# Patient Record
Sex: Female | Born: 1940 | Race: White | Hispanic: No | Marital: Married | Smoking: Never smoker
Health system: Southern US, Community
[De-identification: ages and names within clinical notes are randomized; demographics above are authoritative.]

## PROBLEM LIST (undated history)

## (undated) DIAGNOSIS — I10 Essential (primary) hypertension: Secondary | ICD-10-CM

## (undated) DIAGNOSIS — E7800 Pure hypercholesterolemia, unspecified: Secondary | ICD-10-CM

## (undated) DIAGNOSIS — H3530 Unspecified macular degeneration: Secondary | ICD-10-CM

## (undated) DIAGNOSIS — M8580 Other specified disorders of bone density and structure, unspecified site: Secondary | ICD-10-CM

## (undated) HISTORY — DX: Other specified disorders of bone density and structure, unspecified site: M85.80

## (undated) HISTORY — DX: Essential (primary) hypertension: I10

## (undated) HISTORY — DX: Pure hypercholesterolemia, unspecified: E78.00

## (undated) HISTORY — PX: INGUINAL HERNIA REPAIR: SUR1180

---

## 2005-09-25 ENCOUNTER — Ambulatory Visit: Payer: Self-pay | Admitting: Internal Medicine

## 2005-09-25 ENCOUNTER — Ambulatory Visit (HOSPITAL_COMMUNITY): Admission: RE | Admit: 2005-09-25 | Discharge: 2005-09-25 | Payer: Self-pay | Admitting: Internal Medicine

## 2012-12-23 ENCOUNTER — Ambulatory Visit: Payer: Medicare Other | Admitting: Gastroenterology

## 2012-12-23 ENCOUNTER — Encounter (INDEPENDENT_AMBULATORY_CARE_PROVIDER_SITE_OTHER): Payer: Self-pay

## 2012-12-23 ENCOUNTER — Other Ambulatory Visit: Payer: Self-pay

## 2012-12-23 ENCOUNTER — Encounter: Payer: Self-pay | Admitting: Gastroenterology

## 2012-12-23 ENCOUNTER — Ambulatory Visit (INDEPENDENT_AMBULATORY_CARE_PROVIDER_SITE_OTHER): Payer: Medicare Other | Admitting: Gastroenterology

## 2012-12-23 VITALS — HR 80 | Temp 98.2°F | Ht 64.0 in | Wt 186.8 lb

## 2012-12-23 DIAGNOSIS — Z1211 Encounter for screening for malignant neoplasm of colon: Secondary | ICD-10-CM

## 2012-12-24 ENCOUNTER — Telehealth: Payer: Self-pay

## 2012-12-24 NOTE — Telephone Encounter (Signed)
Gastroenterology Pre-Procedure Review  Request Date: 12/23/2012 Requesting Physician: Was on recall from here  Pt's last colonoscopy was 09/25/2005 by [facility] and he recommended her next in 5 years for high risk screening due to family history of colon cancer in her sister who was diagnosed with colon cancer in her 2's   PATIENT REVIEW QUESTIONS: The patient responded to the following health history questions as indicated:    1. Diabetes Melitis: no 2. Joint replacements in the past 12 months: no 3. Major health problems in the past 3 months: no 4. Has an artificial valve or MVP: no 5. Has a defibrillator: no 6. Has been advised in past to take antibiotics in advance of a procedure like teeth cleaning: no    MEDICATIONS & ALLERGIES:    Patient reports the following regarding taking any blood thinners:   Plavix? no Aspirin? YES Coumadin? no  Patient confirms/reports the following medications:  Current Outpatient Prescriptions  Medication Sig Dispense Refill  . NON FORMULARY Calcium    Once daily      . NON FORMULARY Fiber   Once daily      . NON FORMULARY Co Q 10    One tablet daily      . simvastatin (ZOCOR) 40 MG tablet Take 40 mg by mouth every evening.      . timolol (TIMOPTIC) 0.5 % ophthalmic solution Place 1 drop into both eyes daily.       . Multiple Vitamin (MULTIVITAMIN) capsule Take 1 capsule by mouth daily.       No current facility-administered medications for this visit.    Patient confirms/reports the following allergies:  No Known Allergies  No orders of the defined types were placed in this encounter.    AUTHORIZATION INFORMATION Primary Insurance:   ID #:   Group #:  Pre-Cert / Auth required:  Pre-Cert / Auth #:   Secondary Insurance:  ID #:  Group #:  Pre-Cert / Auth required:  Pre-Cert / Auth #:   SCHEDULE INFORMATION: Procedure has been scheduled as follows:  Date: 01/13/2013  Time: 8:45 AM  Location: [facility] Short Stay  This  Gastroenterology Pre-Precedure Review Form is being routed to the following provider(s): R. Roetta Sessions, MD

## 2012-12-24 NOTE — Progress Notes (Signed)
Patient triaged. Office visit opened in error.

## 2012-12-24 NOTE — Telephone Encounter (Signed)
Appropriate.

## 2012-12-25 MED ORDER — PEG-KCL-NACL-NASULF-NA ASC-C 100 G PO SOLR: 1.0000 | ORAL | Status: DC

## 2012-12-25 NOTE — Telephone Encounter (Signed)
Rx sent to the pharmacy and pt received instructions when she was triaged here in the office.

## 2013-01-13 ENCOUNTER — Encounter (HOSPITAL_COMMUNITY)
Admission: RE | Disposition: A | Discharge status: Home / Self Care | Payer: Self-pay | Source: Ambulatory Visit | Attending: Internal Medicine

## 2013-01-13 ENCOUNTER — Ambulatory Visit (HOSPITAL_COMMUNITY)
Admission: RE | Admit: 2013-01-13 | Discharge: 2013-01-13 | Disposition: A | Discharge status: Home / Self Care | Payer: Medicare Other | Source: Ambulatory Visit | Attending: Internal Medicine | Admitting: Internal Medicine

## 2013-01-13 ENCOUNTER — Encounter (HOSPITAL_COMMUNITY): Payer: Self-pay | Admitting: *Deleted

## 2013-01-13 DIAGNOSIS — K620 Anal polyp: Secondary | ICD-10-CM

## 2013-01-13 DIAGNOSIS — Z1211 Encounter for screening for malignant neoplasm of colon: Secondary | ICD-10-CM | POA: Insufficient documentation

## 2013-01-13 DIAGNOSIS — D128 Benign neoplasm of rectum: Secondary | ICD-10-CM | POA: Insufficient documentation

## 2013-01-13 DIAGNOSIS — K5730 Diverticulosis of large intestine without perforation or abscess without bleeding: Secondary | ICD-10-CM

## 2013-01-13 DIAGNOSIS — Z800 Family history of malignant neoplasm of digestive organs: Secondary | ICD-10-CM

## 2013-01-13 DIAGNOSIS — K621 Rectal polyp: Secondary | ICD-10-CM

## 2013-01-13 HISTORY — PX: COLONOSCOPY: SHX5424

## 2013-01-13 HISTORY — DX: Unspecified macular degeneration: H35.30

## 2013-01-13 SURGERY — COLONOSCOPY
Anesthesia: Moderate Sedation

## 2013-01-13 MED ORDER — MEPERIDINE HCL 100 MG/ML IJ SOLN
INTRAMUSCULAR | Status: AC
  Filled 2013-01-13: qty 2

## 2013-01-13 MED ORDER — ONDANSETRON HCL 4 MG/2ML IJ SOLN
INTRAMUSCULAR | Status: AC
  Filled 2013-01-13: qty 2

## 2013-01-13 MED ORDER — MIDAZOLAM HCL 5 MG/5ML IJ SOLN
INTRAMUSCULAR | Status: AC
  Filled 2013-01-13: qty 10

## 2013-01-13 MED ORDER — SODIUM CHLORIDE 0.9 % IV SOLN
INTRAVENOUS | Status: DC
  Administered 2013-01-13: 1000 mL via INTRAVENOUS

## 2013-01-13 MED ORDER — STERILE WATER FOR IRRIGATION IR SOLN
Status: DC | PRN
  Administered 2013-01-13: 09:00:00

## 2013-01-13 MED ORDER — MEPERIDINE HCL 100 MG/ML IJ SOLN
INTRAMUSCULAR | Status: DC | PRN
  Administered 2013-01-13 (×2): 25 mg via INTRAVENOUS

## 2013-01-13 MED ORDER — MIDAZOLAM HCL 5 MG/5ML IJ SOLN
INTRAMUSCULAR | Status: DC | PRN
  Administered 2013-01-13 (×2): 1 mg via INTRAVENOUS
  Administered 2013-01-13: 2 mg via INTRAVENOUS

## 2013-01-13 MED ORDER — ONDANSETRON HCL 4 MG/2ML IJ SOLN
INTRAMUSCULAR | Status: DC | PRN
  Administered 2013-01-13: 4 mg via INTRAVENOUS

## 2013-01-13 NOTE — Op Note (Signed)
[facility] [address] [place], [address]   COLONOSCOPY PROCEDURE REPORT  PATIENT: Sabrina, Cantrell  MR#:         [id number] BIRTHDATE: [birth year] , 72  yrs. old GENDER: Female ENDOSCOPIST: R.  Roetta Sessions, MD FACP [facility] REFERRED BY:  Donzetta Sprung, M.D. PROCEDURE DATE:  01/13/2013 PROCEDURE:     Colonoscopy with snare polypectomy  INDICATIONS: High-risk rectal cancer screening examination  INFORMED CONSENT:  The risks, benefits, alternatives and imponderables including but not limited to bleeding, perforation as well as the possibility of a missed lesion have been reviewed.  The potential for biopsy, lesion removal, etc. have also been discussed.  Questions have been answered.  All parties agreeable. Please see the history and physical in the medical record for more information.  MEDICATIONS: Versed 4 mg IV and Demerol 50 mg IV in divided doses. Zofran 4 mg IV  DESCRIPTION OF PROCEDURE:  After a digital rectal exam was performed, the EC-[id number] ([id number])  colonoscope was advanced from the anus through the rectum and colon to the area of the cecum, ileocecal valve and appendiceal orifice.  The cecum was deeply intubated.  These structures were well-seen and photographed for the record.  From the level of the cecum and ileocecal valve, the scope was slowly and cautiously withdrawn.  The mucosal surfaces were carefully surveyed utilizing scope tip deflection to facilitate fold flattening as needed.  The scope was pulled down into the rectum where a thorough examination including retroflexion was performed.    FINDINGS:  External hemorrhoids. Adequate preparation. (1) 5 mm polyp in at 10 cm from the anal verge; otherwise, the remainder of the rectal mucosa appeared normal.  The patient had left-sided diverticula; the remainder of the colonic mucosa appeared normal.  THERAPEUTIC / DIAGNOSTIC MANEUVERS PERFORMED:  The above-mentioned polyp was cold snare  removed and recovered for the pathologist.  COMPLICATIONS: None  CECAL WITHDRAWAL TIME:  11 minutes  IMPRESSION:  Colonic diverticulosis. Rectal polyp-removed as described above  RECOMMENDATIONS: Followup on pathology.   _______________________________ eSigned:  R. Roetta Sessions, MD FACP [facility] 01/13/2013 9:13 AM   CC:

## 2013-01-13 NOTE — H&P (Signed)
Primary Care Physician:  Donzetta Sprung, MD Primary Gastroenterologist:  Dr. Jena Gauss  Pre-Procedure History & Physical: HPI:  ONEIKA SIMONIAN is a 72 y.o. female is here for a screening colonoscopy. Positive family history first-degree relative.   No bowel symptoms. Last colonoscopy in 2007 . Past Medical History  Diagnosis Date  . Macular degeneration     History reviewed. No pertinent past surgical history.  Prior to Admission medications   Medication Sig Start Date End Date Taking? Authorizing Provider  Multiple Vitamin (MULTIVITAMIN) capsule Take 1 capsule by mouth daily.   Yes Historical Provider, MD  NON FORMULARY Calcium    Once daily   Yes Historical Provider, MD  NON FORMULARY Fiber   Once daily   Yes Historical Provider, MD  NON FORMULARY Co Q 10    One tablet daily   Yes Historical Provider, MD  peg 3350 powder (MOVIPREP) 100 G SOLR Take 1 kit (200 g total) by mouth as directed. 12/24/12  Yes Corbin Ade, MD  simvastatin (ZOCOR) 40 MG tablet Take 40 mg by mouth every evening.   Yes Historical Provider, MD  timolol (TIMOPTIC) 0.5 % ophthalmic solution Place 1 drop into both eyes daily.  11/11/12  Yes Historical Provider, MD  aspirin 81 MG tablet Take 81 mg by mouth daily.    Historical Provider, MD    Allergies as of 12/23/2012  . (No Known Allergies)    Family History  Problem Relation Age of Onset  . Colon cancer Sister     History   Social History  . Marital Status: Married    Spouse Name: N/A    Number of Children: N/A  . Years of Education: N/A   Occupational History  . Not on file.   Social History Main Topics  . Smoking status: Never Smoker   . Smokeless tobacco: Not on file  . Alcohol Use: No  . Drug Use: No  . Sexual Activity: Not on file   Other Topics Concern  . Not on file   Social History Narrative  . No narrative on file    Review of Systems: See HPI, otherwise negative ROS  Physical Exam: BP 166/96  Pulse 80  Temp(Src) 97.7 F  (36.5 C) (Oral)  Resp 24  Ht 5\' 4"  (1.626 m)  Wt 177 lb (80.287 kg)  BMI 30.37 kg/m2  SpO2 96% General:   Alert,  Well-developed, well-nourished, pleasant and cooperative in NAD Head:  Normocephalic and atraumatic. Eyes:  Sclera clear, no icterus.   Conjunctiva pink. Ears:  Normal auditory acuity. Nose:  No deformity, discharge,  or lesions. Mouth:  No deformity or lesions, dentition normal. Neck:  Supple; no masses or thyromegaly. Lungs:  Clear throughout to auscultation.   No wheezes, crackles, or rhonchi. No acute distress. Heart:  Regular rate and rhythm; no murmurs, clicks, rubs,  or gallops. Abdomen:  Soft, nontender and nondistended. No masses, hepatosplenomegaly or hernias noted. Normal bowel sounds, without guarding, and without rebound.   Msk:  Symmetrical without gross deformities. Normal posture. Pulses:  Normal pulses noted. Extremities:  Without clubbing or edema. Neurologic:  Alert and  oriented x4;  grossly normal neurologically. Skin:  Intact without significant lesions or rashes. Cervical Nodes:  No significant cervical adenopathy. Psych:  Alert and cooperative. Normal mood and affect.  Impression/Plan: JIMMY STIPES is now here to undergo a screening colonoscopy.  High-risk screening examination.  Risks, benefits, limitations, imponderables and alternatives regarding colonoscopy have been reviewed with the patient.  Questions have been answered. All parties agreeable.

## 2013-01-14 ENCOUNTER — Encounter: Payer: Self-pay | Admitting: Internal Medicine

## 2013-01-20 ENCOUNTER — Encounter (HOSPITAL_COMMUNITY): Payer: Self-pay | Admitting: Internal Medicine

## 2014-03-01 DIAGNOSIS — H35051 Retinal neovascularization, unspecified, right eye: Secondary | ICD-10-CM | POA: Diagnosis not present

## 2014-03-01 DIAGNOSIS — H3532 Exudative age-related macular degeneration: Secondary | ICD-10-CM | POA: Diagnosis not present

## 2014-05-18 DIAGNOSIS — H524 Presbyopia: Secondary | ICD-10-CM | POA: Diagnosis not present

## 2014-05-18 DIAGNOSIS — H5210 Myopia, unspecified eye: Secondary | ICD-10-CM | POA: Diagnosis not present

## 2014-07-01 DIAGNOSIS — H3531 Nonexudative age-related macular degeneration: Secondary | ICD-10-CM | POA: Diagnosis not present

## 2014-07-01 DIAGNOSIS — H3532 Exudative age-related macular degeneration: Secondary | ICD-10-CM | POA: Diagnosis not present

## 2014-08-16 DIAGNOSIS — H3532 Exudative age-related macular degeneration: Secondary | ICD-10-CM | POA: Diagnosis not present

## 2014-08-19 DIAGNOSIS — Z1231 Encounter for screening mammogram for malignant neoplasm of breast: Secondary | ICD-10-CM | POA: Diagnosis not present

## 2014-10-01 DIAGNOSIS — H3532 Exudative age-related macular degeneration: Secondary | ICD-10-CM | POA: Diagnosis not present

## 2014-11-16 DIAGNOSIS — H3532 Exudative age-related macular degeneration: Secondary | ICD-10-CM | POA: Diagnosis not present

## 2014-11-30 DIAGNOSIS — Z0000 Encounter for general adult medical examination without abnormal findings: Secondary | ICD-10-CM | POA: Diagnosis not present

## 2014-11-30 DIAGNOSIS — R5383 Other fatigue: Secondary | ICD-10-CM | POA: Diagnosis not present

## 2014-11-30 DIAGNOSIS — E782 Mixed hyperlipidemia: Secondary | ICD-10-CM | POA: Diagnosis not present

## 2014-12-08 DIAGNOSIS — K219 Gastro-esophageal reflux disease without esophagitis: Secondary | ICD-10-CM | POA: Diagnosis not present

## 2014-12-08 DIAGNOSIS — E8881 Metabolic syndrome: Secondary | ICD-10-CM | POA: Diagnosis not present

## 2014-12-08 DIAGNOSIS — Z23 Encounter for immunization: Secondary | ICD-10-CM | POA: Diagnosis not present

## 2014-12-08 DIAGNOSIS — E782 Mixed hyperlipidemia: Secondary | ICD-10-CM | POA: Diagnosis not present

## 2014-12-08 DIAGNOSIS — I10 Essential (primary) hypertension: Secondary | ICD-10-CM | POA: Diagnosis not present

## 2014-12-08 DIAGNOSIS — Z0001 Encounter for general adult medical examination with abnormal findings: Secondary | ICD-10-CM | POA: Diagnosis not present

## 2015-01-11 DIAGNOSIS — H353211 Exudative age-related macular degeneration, right eye, with active choroidal neovascularization: Secondary | ICD-10-CM | POA: Diagnosis not present

## 2015-01-13 DIAGNOSIS — K219 Gastro-esophageal reflux disease without esophagitis: Secondary | ICD-10-CM | POA: Diagnosis not present

## 2015-01-13 DIAGNOSIS — E8881 Metabolic syndrome: Secondary | ICD-10-CM | POA: Diagnosis not present

## 2015-01-13 DIAGNOSIS — Z1389 Encounter for screening for other disorder: Secondary | ICD-10-CM | POA: Diagnosis not present

## 2015-01-13 DIAGNOSIS — I10 Essential (primary) hypertension: Secondary | ICD-10-CM | POA: Diagnosis not present

## 2015-01-13 DIAGNOSIS — Z9189 Other specified personal risk factors, not elsewhere classified: Secondary | ICD-10-CM | POA: Diagnosis not present

## 2015-01-13 DIAGNOSIS — E782 Mixed hyperlipidemia: Secondary | ICD-10-CM | POA: Diagnosis not present

## 2015-03-08 DIAGNOSIS — H35051 Retinal neovascularization, unspecified, right eye: Secondary | ICD-10-CM | POA: Diagnosis not present

## 2015-03-08 DIAGNOSIS — H353211 Exudative age-related macular degeneration, right eye, with active choroidal neovascularization: Secondary | ICD-10-CM | POA: Diagnosis not present

## 2015-03-08 DIAGNOSIS — H353222 Exudative age-related macular degeneration, left eye, with inactive choroidal neovascularization: Secondary | ICD-10-CM | POA: Diagnosis not present

## 2015-05-17 DIAGNOSIS — H353211 Exudative age-related macular degeneration, right eye, with active choroidal neovascularization: Secondary | ICD-10-CM | POA: Diagnosis not present

## 2015-06-14 DIAGNOSIS — R5382 Chronic fatigue, unspecified: Secondary | ICD-10-CM | POA: Diagnosis not present

## 2015-06-14 DIAGNOSIS — E782 Mixed hyperlipidemia: Secondary | ICD-10-CM | POA: Diagnosis not present

## 2015-06-14 DIAGNOSIS — I10 Essential (primary) hypertension: Secondary | ICD-10-CM | POA: Diagnosis not present

## 2015-06-14 DIAGNOSIS — K219 Gastro-esophageal reflux disease without esophagitis: Secondary | ICD-10-CM | POA: Diagnosis not present

## 2015-06-14 DIAGNOSIS — R5383 Other fatigue: Secondary | ICD-10-CM | POA: Diagnosis not present

## 2015-06-14 DIAGNOSIS — E8881 Metabolic syndrome: Secondary | ICD-10-CM | POA: Diagnosis not present

## 2015-06-16 DIAGNOSIS — E782 Mixed hyperlipidemia: Secondary | ICD-10-CM | POA: Diagnosis not present

## 2015-06-16 DIAGNOSIS — E8881 Metabolic syndrome: Secondary | ICD-10-CM | POA: Diagnosis not present

## 2015-06-16 DIAGNOSIS — K219 Gastro-esophageal reflux disease without esophagitis: Secondary | ICD-10-CM | POA: Diagnosis not present

## 2015-06-16 DIAGNOSIS — I10 Essential (primary) hypertension: Secondary | ICD-10-CM | POA: Diagnosis not present

## 2015-06-29 DIAGNOSIS — I10 Essential (primary) hypertension: Secondary | ICD-10-CM | POA: Diagnosis not present

## 2015-06-29 DIAGNOSIS — Z7982 Long term (current) use of aspirin: Secondary | ICD-10-CM | POA: Diagnosis not present

## 2015-06-29 DIAGNOSIS — Z79899 Other long term (current) drug therapy: Secondary | ICD-10-CM | POA: Diagnosis not present

## 2015-06-29 DIAGNOSIS — Z780 Asymptomatic menopausal state: Secondary | ICD-10-CM | POA: Diagnosis not present

## 2015-06-29 DIAGNOSIS — M810 Age-related osteoporosis without current pathological fracture: Secondary | ICD-10-CM | POA: Diagnosis not present

## 2015-06-29 DIAGNOSIS — E7800 Pure hypercholesterolemia, unspecified: Secondary | ICD-10-CM | POA: Diagnosis not present

## 2015-06-29 DIAGNOSIS — Z8262 Family history of osteoporosis: Secondary | ICD-10-CM | POA: Diagnosis not present

## 2015-08-08 DIAGNOSIS — H353222 Exudative age-related macular degeneration, left eye, with inactive choroidal neovascularization: Secondary | ICD-10-CM | POA: Diagnosis not present

## 2015-08-08 DIAGNOSIS — H35352 Cystoid macular degeneration, left eye: Secondary | ICD-10-CM | POA: Diagnosis not present

## 2015-08-08 DIAGNOSIS — H353211 Exudative age-related macular degeneration, right eye, with active choroidal neovascularization: Secondary | ICD-10-CM | POA: Diagnosis not present

## 2015-09-09 DIAGNOSIS — Z1231 Encounter for screening mammogram for malignant neoplasm of breast: Secondary | ICD-10-CM | POA: Diagnosis not present

## 2015-11-08 DIAGNOSIS — Z6833 Body mass index (BMI) 33.0-33.9, adult: Secondary | ICD-10-CM | POA: Diagnosis not present

## 2015-11-08 DIAGNOSIS — R05 Cough: Secondary | ICD-10-CM | POA: Diagnosis not present

## 2015-11-08 DIAGNOSIS — J019 Acute sinusitis, unspecified: Secondary | ICD-10-CM | POA: Diagnosis not present

## 2015-12-20 DIAGNOSIS — H353222 Exudative age-related macular degeneration, left eye, with inactive choroidal neovascularization: Secondary | ICD-10-CM | POA: Diagnosis not present

## 2015-12-20 DIAGNOSIS — H353211 Exudative age-related macular degeneration, right eye, with active choroidal neovascularization: Secondary | ICD-10-CM | POA: Diagnosis not present

## 2015-12-20 DIAGNOSIS — H35051 Retinal neovascularization, unspecified, right eye: Secondary | ICD-10-CM | POA: Diagnosis not present

## 2015-12-20 DIAGNOSIS — H35352 Cystoid macular degeneration, left eye: Secondary | ICD-10-CM | POA: Diagnosis not present

## 2015-12-26 DIAGNOSIS — H353211 Exudative age-related macular degeneration, right eye, with active choroidal neovascularization: Secondary | ICD-10-CM | POA: Diagnosis not present

## 2016-01-05 DIAGNOSIS — K219 Gastro-esophageal reflux disease without esophagitis: Secondary | ICD-10-CM | POA: Diagnosis not present

## 2016-01-05 DIAGNOSIS — R5383 Other fatigue: Secondary | ICD-10-CM | POA: Diagnosis not present

## 2016-01-05 DIAGNOSIS — E8881 Metabolic syndrome: Secondary | ICD-10-CM | POA: Diagnosis not present

## 2016-01-05 DIAGNOSIS — I10 Essential (primary) hypertension: Secondary | ICD-10-CM | POA: Diagnosis not present

## 2016-01-05 DIAGNOSIS — E782 Mixed hyperlipidemia: Secondary | ICD-10-CM | POA: Diagnosis not present

## 2016-01-10 DIAGNOSIS — Z23 Encounter for immunization: Secondary | ICD-10-CM | POA: Diagnosis not present

## 2016-01-10 DIAGNOSIS — E782 Mixed hyperlipidemia: Secondary | ICD-10-CM | POA: Diagnosis not present

## 2016-01-10 DIAGNOSIS — Z6832 Body mass index (BMI) 32.0-32.9, adult: Secondary | ICD-10-CM | POA: Diagnosis not present

## 2016-01-10 DIAGNOSIS — K219 Gastro-esophageal reflux disease without esophagitis: Secondary | ICD-10-CM | POA: Diagnosis not present

## 2016-01-10 DIAGNOSIS — I10 Essential (primary) hypertension: Secondary | ICD-10-CM | POA: Diagnosis not present

## 2016-01-10 DIAGNOSIS — Z1212 Encounter for screening for malignant neoplasm of rectum: Secondary | ICD-10-CM | POA: Diagnosis not present

## 2016-01-10 DIAGNOSIS — E8881 Metabolic syndrome: Secondary | ICD-10-CM | POA: Diagnosis not present

## 2016-01-10 DIAGNOSIS — Z0001 Encounter for general adult medical examination with abnormal findings: Secondary | ICD-10-CM | POA: Diagnosis not present

## 2016-02-02 DIAGNOSIS — H353211 Exudative age-related macular degeneration, right eye, with active choroidal neovascularization: Secondary | ICD-10-CM | POA: Diagnosis not present

## 2016-03-22 DIAGNOSIS — H353211 Exudative age-related macular degeneration, right eye, with active choroidal neovascularization: Secondary | ICD-10-CM | POA: Diagnosis not present

## 2016-05-21 DIAGNOSIS — H35352 Cystoid macular degeneration, left eye: Secondary | ICD-10-CM | POA: Diagnosis not present

## 2016-05-21 DIAGNOSIS — H353222 Exudative age-related macular degeneration, left eye, with inactive choroidal neovascularization: Secondary | ICD-10-CM | POA: Diagnosis not present

## 2016-05-21 DIAGNOSIS — H353211 Exudative age-related macular degeneration, right eye, with active choroidal neovascularization: Secondary | ICD-10-CM | POA: Diagnosis not present

## 2016-07-03 DIAGNOSIS — E782 Mixed hyperlipidemia: Secondary | ICD-10-CM | POA: Diagnosis not present

## 2016-07-03 DIAGNOSIS — I10 Essential (primary) hypertension: Secondary | ICD-10-CM | POA: Diagnosis not present

## 2016-07-05 DIAGNOSIS — E6609 Other obesity due to excess calories: Secondary | ICD-10-CM | POA: Diagnosis not present

## 2016-07-05 DIAGNOSIS — I10 Essential (primary) hypertension: Secondary | ICD-10-CM | POA: Diagnosis not present

## 2016-07-05 DIAGNOSIS — E782 Mixed hyperlipidemia: Secondary | ICD-10-CM | POA: Diagnosis not present

## 2016-07-05 DIAGNOSIS — H35352 Cystoid macular degeneration, left eye: Secondary | ICD-10-CM | POA: Diagnosis not present

## 2016-07-05 DIAGNOSIS — Z6831 Body mass index (BMI) 31.0-31.9, adult: Secondary | ICD-10-CM | POA: Diagnosis not present

## 2016-07-05 DIAGNOSIS — G4733 Obstructive sleep apnea (adult) (pediatric): Secondary | ICD-10-CM | POA: Diagnosis not present

## 2016-07-05 DIAGNOSIS — E8881 Metabolic syndrome: Secondary | ICD-10-CM | POA: Diagnosis not present

## 2016-07-05 DIAGNOSIS — Z1389 Encounter for screening for other disorder: Secondary | ICD-10-CM | POA: Diagnosis not present

## 2016-07-05 DIAGNOSIS — H353222 Exudative age-related macular degeneration, left eye, with inactive choroidal neovascularization: Secondary | ICD-10-CM | POA: Diagnosis not present

## 2016-07-05 DIAGNOSIS — K219 Gastro-esophageal reflux disease without esophagitis: Secondary | ICD-10-CM | POA: Diagnosis not present

## 2016-07-05 DIAGNOSIS — H353211 Exudative age-related macular degeneration, right eye, with active choroidal neovascularization: Secondary | ICD-10-CM | POA: Diagnosis not present

## 2016-07-31 DIAGNOSIS — K219 Gastro-esophageal reflux disease without esophagitis: Secondary | ICD-10-CM | POA: Diagnosis not present

## 2016-07-31 DIAGNOSIS — E119 Type 2 diabetes mellitus without complications: Secondary | ICD-10-CM | POA: Diagnosis not present

## 2016-07-31 DIAGNOSIS — G4733 Obstructive sleep apnea (adult) (pediatric): Secondary | ICD-10-CM | POA: Diagnosis not present

## 2016-07-31 DIAGNOSIS — E785 Hyperlipidemia, unspecified: Secondary | ICD-10-CM | POA: Diagnosis not present

## 2016-08-16 DIAGNOSIS — H353231 Exudative age-related macular degeneration, bilateral, with active choroidal neovascularization: Secondary | ICD-10-CM | POA: Diagnosis not present

## 2016-08-16 DIAGNOSIS — H353222 Exudative age-related macular degeneration, left eye, with inactive choroidal neovascularization: Secondary | ICD-10-CM | POA: Diagnosis not present

## 2016-08-16 DIAGNOSIS — H353124 Nonexudative age-related macular degeneration, left eye, advanced atrophic with subfoveal involvement: Secondary | ICD-10-CM | POA: Diagnosis not present

## 2016-08-16 DIAGNOSIS — H353134 Nonexudative age-related macular degeneration, bilateral, advanced atrophic with subfoveal involvement: Secondary | ICD-10-CM | POA: Diagnosis not present

## 2016-08-16 DIAGNOSIS — H353212 Exudative age-related macular degeneration, right eye, with inactive choroidal neovascularization: Secondary | ICD-10-CM | POA: Diagnosis not present

## 2016-08-16 DIAGNOSIS — H401133 Primary open-angle glaucoma, bilateral, severe stage: Secondary | ICD-10-CM | POA: Diagnosis not present

## 2016-08-16 DIAGNOSIS — H353114 Nonexudative age-related macular degeneration, right eye, advanced atrophic with subfoveal involvement: Secondary | ICD-10-CM | POA: Diagnosis not present

## 2016-09-26 DIAGNOSIS — H353124 Nonexudative age-related macular degeneration, left eye, advanced atrophic with subfoveal involvement: Secondary | ICD-10-CM | POA: Diagnosis not present

## 2016-09-26 DIAGNOSIS — H401132 Primary open-angle glaucoma, bilateral, moderate stage: Secondary | ICD-10-CM | POA: Diagnosis not present

## 2016-09-26 DIAGNOSIS — H2513 Age-related nuclear cataract, bilateral: Secondary | ICD-10-CM | POA: Diagnosis not present

## 2016-09-26 DIAGNOSIS — H353211 Exudative age-related macular degeneration, right eye, with active choroidal neovascularization: Secondary | ICD-10-CM | POA: Diagnosis not present

## 2016-10-04 DIAGNOSIS — H353134 Nonexudative age-related macular degeneration, bilateral, advanced atrophic with subfoveal involvement: Secondary | ICD-10-CM | POA: Diagnosis not present

## 2016-10-04 DIAGNOSIS — H401133 Primary open-angle glaucoma, bilateral, severe stage: Secondary | ICD-10-CM | POA: Diagnosis not present

## 2016-10-04 DIAGNOSIS — H353124 Nonexudative age-related macular degeneration, left eye, advanced atrophic with subfoveal involvement: Secondary | ICD-10-CM | POA: Diagnosis not present

## 2016-10-04 DIAGNOSIS — H353211 Exudative age-related macular degeneration, right eye, with active choroidal neovascularization: Secondary | ICD-10-CM | POA: Diagnosis not present

## 2016-10-04 DIAGNOSIS — H353114 Nonexudative age-related macular degeneration, right eye, advanced atrophic with subfoveal involvement: Secondary | ICD-10-CM | POA: Diagnosis not present

## 2016-11-12 DIAGNOSIS — H353124 Nonexudative age-related macular degeneration, left eye, advanced atrophic with subfoveal involvement: Secondary | ICD-10-CM | POA: Diagnosis not present

## 2016-11-12 DIAGNOSIS — H353211 Exudative age-related macular degeneration, right eye, with active choroidal neovascularization: Secondary | ICD-10-CM | POA: Diagnosis not present

## 2016-11-12 DIAGNOSIS — H2513 Age-related nuclear cataract, bilateral: Secondary | ICD-10-CM | POA: Diagnosis not present

## 2016-11-12 DIAGNOSIS — H401132 Primary open-angle glaucoma, bilateral, moderate stage: Secondary | ICD-10-CM | POA: Diagnosis not present

## 2016-11-13 DIAGNOSIS — G4733 Obstructive sleep apnea (adult) (pediatric): Secondary | ICD-10-CM | POA: Diagnosis not present

## 2016-11-15 DIAGNOSIS — H353124 Nonexudative age-related macular degeneration, left eye, advanced atrophic with subfoveal involvement: Secondary | ICD-10-CM | POA: Diagnosis not present

## 2016-11-15 DIAGNOSIS — H353211 Exudative age-related macular degeneration, right eye, with active choroidal neovascularization: Secondary | ICD-10-CM | POA: Diagnosis not present

## 2016-11-15 DIAGNOSIS — H353114 Nonexudative age-related macular degeneration, right eye, advanced atrophic with subfoveal involvement: Secondary | ICD-10-CM | POA: Diagnosis not present

## 2016-11-15 DIAGNOSIS — H353134 Nonexudative age-related macular degeneration, bilateral, advanced atrophic with subfoveal involvement: Secondary | ICD-10-CM | POA: Diagnosis not present

## 2016-11-15 DIAGNOSIS — H353222 Exudative age-related macular degeneration, left eye, with inactive choroidal neovascularization: Secondary | ICD-10-CM | POA: Diagnosis not present

## 2016-12-05 DIAGNOSIS — H401132 Primary open-angle glaucoma, bilateral, moderate stage: Secondary | ICD-10-CM | POA: Diagnosis not present

## 2016-12-05 DIAGNOSIS — H353124 Nonexudative age-related macular degeneration, left eye, advanced atrophic with subfoveal involvement: Secondary | ICD-10-CM | POA: Diagnosis not present

## 2016-12-05 DIAGNOSIS — H2513 Age-related nuclear cataract, bilateral: Secondary | ICD-10-CM | POA: Diagnosis not present

## 2016-12-05 DIAGNOSIS — H353211 Exudative age-related macular degeneration, right eye, with active choroidal neovascularization: Secondary | ICD-10-CM | POA: Diagnosis not present

## 2016-12-26 DIAGNOSIS — I10 Essential (primary) hypertension: Secondary | ICD-10-CM | POA: Diagnosis not present

## 2016-12-26 DIAGNOSIS — K219 Gastro-esophageal reflux disease without esophagitis: Secondary | ICD-10-CM | POA: Diagnosis not present

## 2016-12-26 DIAGNOSIS — R5383 Other fatigue: Secondary | ICD-10-CM | POA: Diagnosis not present

## 2016-12-26 DIAGNOSIS — E8881 Metabolic syndrome: Secondary | ICD-10-CM | POA: Diagnosis not present

## 2016-12-26 DIAGNOSIS — Z9189 Other specified personal risk factors, not elsewhere classified: Secondary | ICD-10-CM | POA: Diagnosis not present

## 2016-12-26 DIAGNOSIS — E782 Mixed hyperlipidemia: Secondary | ICD-10-CM | POA: Diagnosis not present

## 2017-01-01 DIAGNOSIS — Z1212 Encounter for screening for malignant neoplasm of rectum: Secondary | ICD-10-CM | POA: Diagnosis not present

## 2017-01-01 DIAGNOSIS — E782 Mixed hyperlipidemia: Secondary | ICD-10-CM | POA: Diagnosis not present

## 2017-01-01 DIAGNOSIS — I10 Essential (primary) hypertension: Secondary | ICD-10-CM | POA: Diagnosis not present

## 2017-01-01 DIAGNOSIS — E8881 Metabolic syndrome: Secondary | ICD-10-CM | POA: Diagnosis not present

## 2017-01-01 DIAGNOSIS — R7301 Impaired fasting glucose: Secondary | ICD-10-CM | POA: Diagnosis not present

## 2017-01-01 DIAGNOSIS — Z0001 Encounter for general adult medical examination with abnormal findings: Secondary | ICD-10-CM | POA: Diagnosis not present

## 2017-01-01 DIAGNOSIS — Z6832 Body mass index (BMI) 32.0-32.9, adult: Secondary | ICD-10-CM | POA: Diagnosis not present

## 2017-01-01 DIAGNOSIS — Z23 Encounter for immunization: Secondary | ICD-10-CM | POA: Diagnosis not present

## 2017-01-02 DIAGNOSIS — H353114 Nonexudative age-related macular degeneration, right eye, advanced atrophic with subfoveal involvement: Secondary | ICD-10-CM | POA: Diagnosis not present

## 2017-01-02 DIAGNOSIS — H353211 Exudative age-related macular degeneration, right eye, with active choroidal neovascularization: Secondary | ICD-10-CM | POA: Diagnosis not present

## 2017-01-02 DIAGNOSIS — H353134 Nonexudative age-related macular degeneration, bilateral, advanced atrophic with subfoveal involvement: Secondary | ICD-10-CM | POA: Diagnosis not present

## 2017-01-02 DIAGNOSIS — H353124 Nonexudative age-related macular degeneration, left eye, advanced atrophic with subfoveal involvement: Secondary | ICD-10-CM | POA: Diagnosis not present

## 2017-01-02 DIAGNOSIS — H353222 Exudative age-related macular degeneration, left eye, with inactive choroidal neovascularization: Secondary | ICD-10-CM | POA: Diagnosis not present

## 2017-01-22 DIAGNOSIS — Z1231 Encounter for screening mammogram for malignant neoplasm of breast: Secondary | ICD-10-CM | POA: Diagnosis not present

## 2017-03-04 DIAGNOSIS — H401133 Primary open-angle glaucoma, bilateral, severe stage: Secondary | ICD-10-CM | POA: Diagnosis not present

## 2017-03-04 DIAGNOSIS — H353134 Nonexudative age-related macular degeneration, bilateral, advanced atrophic with subfoveal involvement: Secondary | ICD-10-CM | POA: Diagnosis not present

## 2017-03-04 DIAGNOSIS — H353211 Exudative age-related macular degeneration, right eye, with active choroidal neovascularization: Secondary | ICD-10-CM | POA: Diagnosis not present

## 2017-03-04 DIAGNOSIS — H353114 Nonexudative age-related macular degeneration, right eye, advanced atrophic with subfoveal involvement: Secondary | ICD-10-CM | POA: Diagnosis not present

## 2017-03-04 DIAGNOSIS — H353124 Nonexudative age-related macular degeneration, left eye, advanced atrophic with subfoveal involvement: Secondary | ICD-10-CM | POA: Diagnosis not present

## 2017-04-11 DIAGNOSIS — J019 Acute sinusitis, unspecified: Secondary | ICD-10-CM | POA: Diagnosis not present

## 2017-04-11 DIAGNOSIS — Z6833 Body mass index (BMI) 33.0-33.9, adult: Secondary | ICD-10-CM | POA: Diagnosis not present

## 2017-04-29 DIAGNOSIS — H2513 Age-related nuclear cataract, bilateral: Secondary | ICD-10-CM | POA: Diagnosis not present

## 2017-04-29 DIAGNOSIS — H353211 Exudative age-related macular degeneration, right eye, with active choroidal neovascularization: Secondary | ICD-10-CM | POA: Diagnosis not present

## 2017-04-29 DIAGNOSIS — H353124 Nonexudative age-related macular degeneration, left eye, advanced atrophic with subfoveal involvement: Secondary | ICD-10-CM | POA: Diagnosis not present

## 2017-04-29 DIAGNOSIS — H401132 Primary open-angle glaucoma, bilateral, moderate stage: Secondary | ICD-10-CM | POA: Diagnosis not present

## 2017-05-16 DIAGNOSIS — H353211 Exudative age-related macular degeneration, right eye, with active choroidal neovascularization: Secondary | ICD-10-CM | POA: Diagnosis not present

## 2017-05-16 DIAGNOSIS — H401133 Primary open-angle glaucoma, bilateral, severe stage: Secondary | ICD-10-CM | POA: Diagnosis not present

## 2017-05-16 DIAGNOSIS — H4052X3 Glaucoma secondary to other eye disorders, left eye, severe stage: Secondary | ICD-10-CM | POA: Diagnosis not present

## 2017-05-16 DIAGNOSIS — H353114 Nonexudative age-related macular degeneration, right eye, advanced atrophic with subfoveal involvement: Secondary | ICD-10-CM | POA: Diagnosis not present

## 2017-05-16 DIAGNOSIS — H353124 Nonexudative age-related macular degeneration, left eye, advanced atrophic with subfoveal involvement: Secondary | ICD-10-CM | POA: Diagnosis not present

## 2017-07-09 DIAGNOSIS — I10 Essential (primary) hypertension: Secondary | ICD-10-CM | POA: Diagnosis not present

## 2017-07-09 DIAGNOSIS — E782 Mixed hyperlipidemia: Secondary | ICD-10-CM | POA: Diagnosis not present

## 2017-07-09 DIAGNOSIS — Z0001 Encounter for general adult medical examination with abnormal findings: Secondary | ICD-10-CM | POA: Diagnosis not present

## 2017-07-09 DIAGNOSIS — Z1331 Encounter for screening for depression: Secondary | ICD-10-CM | POA: Diagnosis not present

## 2017-07-09 DIAGNOSIS — Z1389 Encounter for screening for other disorder: Secondary | ICD-10-CM | POA: Diagnosis not present

## 2017-07-09 DIAGNOSIS — Z23 Encounter for immunization: Secondary | ICD-10-CM | POA: Diagnosis not present

## 2017-07-09 DIAGNOSIS — Z6832 Body mass index (BMI) 32.0-32.9, adult: Secondary | ICD-10-CM | POA: Diagnosis not present

## 2017-07-09 DIAGNOSIS — R7301 Impaired fasting glucose: Secondary | ICD-10-CM | POA: Diagnosis not present

## 2017-07-11 DIAGNOSIS — H4052X3 Glaucoma secondary to other eye disorders, left eye, severe stage: Secondary | ICD-10-CM | POA: Diagnosis not present

## 2017-07-11 DIAGNOSIS — H35051 Retinal neovascularization, unspecified, right eye: Secondary | ICD-10-CM | POA: Diagnosis not present

## 2017-07-11 DIAGNOSIS — H353114 Nonexudative age-related macular degeneration, right eye, advanced atrophic with subfoveal involvement: Secondary | ICD-10-CM | POA: Diagnosis not present

## 2017-07-11 DIAGNOSIS — H353211 Exudative age-related macular degeneration, right eye, with active choroidal neovascularization: Secondary | ICD-10-CM | POA: Diagnosis not present

## 2017-07-11 DIAGNOSIS — H401133 Primary open-angle glaucoma, bilateral, severe stage: Secondary | ICD-10-CM | POA: Diagnosis not present

## 2017-07-11 DIAGNOSIS — H353222 Exudative age-related macular degeneration, left eye, with inactive choroidal neovascularization: Secondary | ICD-10-CM | POA: Diagnosis not present

## 2017-08-13 DIAGNOSIS — H353124 Nonexudative age-related macular degeneration, left eye, advanced atrophic with subfoveal involvement: Secondary | ICD-10-CM | POA: Diagnosis not present

## 2017-08-13 DIAGNOSIS — H353211 Exudative age-related macular degeneration, right eye, with active choroidal neovascularization: Secondary | ICD-10-CM | POA: Diagnosis not present

## 2017-08-13 DIAGNOSIS — H2513 Age-related nuclear cataract, bilateral: Secondary | ICD-10-CM | POA: Diagnosis not present

## 2017-08-13 DIAGNOSIS — H401132 Primary open-angle glaucoma, bilateral, moderate stage: Secondary | ICD-10-CM | POA: Diagnosis not present

## 2017-08-22 DIAGNOSIS — M810 Age-related osteoporosis without current pathological fracture: Secondary | ICD-10-CM | POA: Diagnosis not present

## 2017-08-22 DIAGNOSIS — M85851 Other specified disorders of bone density and structure, right thigh: Secondary | ICD-10-CM | POA: Diagnosis not present

## 2017-09-30 DIAGNOSIS — H353212 Exudative age-related macular degeneration, right eye, with inactive choroidal neovascularization: Secondary | ICD-10-CM | POA: Diagnosis not present

## 2017-09-30 DIAGNOSIS — H353232 Exudative age-related macular degeneration, bilateral, with inactive choroidal neovascularization: Secondary | ICD-10-CM | POA: Diagnosis not present

## 2017-09-30 DIAGNOSIS — H353134 Nonexudative age-related macular degeneration, bilateral, advanced atrophic with subfoveal involvement: Secondary | ICD-10-CM | POA: Diagnosis not present

## 2017-09-30 DIAGNOSIS — H353114 Nonexudative age-related macular degeneration, right eye, advanced atrophic with subfoveal involvement: Secondary | ICD-10-CM | POA: Diagnosis not present

## 2017-09-30 DIAGNOSIS — H353124 Nonexudative age-related macular degeneration, left eye, advanced atrophic with subfoveal involvement: Secondary | ICD-10-CM | POA: Diagnosis not present

## 2017-09-30 DIAGNOSIS — H353222 Exudative age-related macular degeneration, left eye, with inactive choroidal neovascularization: Secondary | ICD-10-CM | POA: Diagnosis not present

## 2017-09-30 DIAGNOSIS — H401133 Primary open-angle glaucoma, bilateral, severe stage: Secondary | ICD-10-CM | POA: Diagnosis not present

## 2017-10-02 DIAGNOSIS — G4733 Obstructive sleep apnea (adult) (pediatric): Secondary | ICD-10-CM | POA: Diagnosis not present

## 2017-11-02 DIAGNOSIS — G4733 Obstructive sleep apnea (adult) (pediatric): Secondary | ICD-10-CM | POA: Diagnosis not present

## 2017-11-25 DIAGNOSIS — H353114 Nonexudative age-related macular degeneration, right eye, advanced atrophic with subfoveal involvement: Secondary | ICD-10-CM | POA: Diagnosis not present

## 2017-11-25 DIAGNOSIS — H4052X3 Glaucoma secondary to other eye disorders, left eye, severe stage: Secondary | ICD-10-CM | POA: Diagnosis not present

## 2017-11-25 DIAGNOSIS — H353222 Exudative age-related macular degeneration, left eye, with inactive choroidal neovascularization: Secondary | ICD-10-CM | POA: Diagnosis not present

## 2017-11-25 DIAGNOSIS — H401133 Primary open-angle glaucoma, bilateral, severe stage: Secondary | ICD-10-CM | POA: Diagnosis not present

## 2017-11-25 DIAGNOSIS — H353211 Exudative age-related macular degeneration, right eye, with active choroidal neovascularization: Secondary | ICD-10-CM | POA: Diagnosis not present

## 2017-11-25 DIAGNOSIS — H353134 Nonexudative age-related macular degeneration, bilateral, advanced atrophic with subfoveal involvement: Secondary | ICD-10-CM | POA: Diagnosis not present

## 2017-12-02 DIAGNOSIS — G4733 Obstructive sleep apnea (adult) (pediatric): Secondary | ICD-10-CM | POA: Diagnosis not present

## 2017-12-09 ENCOUNTER — Encounter: Payer: Self-pay | Admitting: Internal Medicine

## 2017-12-11 DIAGNOSIS — Z23 Encounter for immunization: Secondary | ICD-10-CM | POA: Diagnosis not present

## 2017-12-11 DIAGNOSIS — E6609 Other obesity due to excess calories: Secondary | ICD-10-CM | POA: Diagnosis not present

## 2017-12-11 DIAGNOSIS — E782 Mixed hyperlipidemia: Secondary | ICD-10-CM | POA: Diagnosis not present

## 2017-12-11 DIAGNOSIS — Z6833 Body mass index (BMI) 33.0-33.9, adult: Secondary | ICD-10-CM | POA: Diagnosis not present

## 2017-12-11 DIAGNOSIS — K219 Gastro-esophageal reflux disease without esophagitis: Secondary | ICD-10-CM | POA: Diagnosis not present

## 2017-12-11 DIAGNOSIS — E8881 Metabolic syndrome: Secondary | ICD-10-CM | POA: Diagnosis not present

## 2017-12-11 DIAGNOSIS — R7301 Impaired fasting glucose: Secondary | ICD-10-CM | POA: Diagnosis not present

## 2017-12-11 DIAGNOSIS — I10 Essential (primary) hypertension: Secondary | ICD-10-CM | POA: Diagnosis not present

## 2017-12-18 DIAGNOSIS — M545 Low back pain: Secondary | ICD-10-CM | POA: Diagnosis not present

## 2017-12-24 DIAGNOSIS — M545 Low back pain: Secondary | ICD-10-CM | POA: Diagnosis not present

## 2017-12-31 DIAGNOSIS — M545 Low back pain: Secondary | ICD-10-CM | POA: Diagnosis not present

## 2018-01-02 DIAGNOSIS — G4733 Obstructive sleep apnea (adult) (pediatric): Secondary | ICD-10-CM | POA: Diagnosis not present

## 2018-01-03 DIAGNOSIS — G4733 Obstructive sleep apnea (adult) (pediatric): Secondary | ICD-10-CM | POA: Diagnosis not present

## 2018-01-13 DIAGNOSIS — K219 Gastro-esophageal reflux disease without esophagitis: Secondary | ICD-10-CM | POA: Diagnosis not present

## 2018-01-13 DIAGNOSIS — E782 Mixed hyperlipidemia: Secondary | ICD-10-CM | POA: Diagnosis not present

## 2018-01-13 DIAGNOSIS — I10 Essential (primary) hypertension: Secondary | ICD-10-CM | POA: Diagnosis not present

## 2018-01-13 DIAGNOSIS — R7301 Impaired fasting glucose: Secondary | ICD-10-CM | POA: Diagnosis not present

## 2018-01-13 DIAGNOSIS — E8881 Metabolic syndrome: Secondary | ICD-10-CM | POA: Diagnosis not present

## 2018-01-14 DIAGNOSIS — M545 Low back pain: Secondary | ICD-10-CM | POA: Diagnosis not present

## 2018-01-15 DIAGNOSIS — R7301 Impaired fasting glucose: Secondary | ICD-10-CM | POA: Diagnosis not present

## 2018-01-15 DIAGNOSIS — E8881 Metabolic syndrome: Secondary | ICD-10-CM | POA: Diagnosis not present

## 2018-01-15 DIAGNOSIS — K219 Gastro-esophageal reflux disease without esophagitis: Secondary | ICD-10-CM | POA: Diagnosis not present

## 2018-01-15 DIAGNOSIS — M545 Low back pain: Secondary | ICD-10-CM | POA: Diagnosis not present

## 2018-01-15 DIAGNOSIS — Z6833 Body mass index (BMI) 33.0-33.9, adult: Secondary | ICD-10-CM | POA: Diagnosis not present

## 2018-01-15 DIAGNOSIS — E782 Mixed hyperlipidemia: Secondary | ICD-10-CM | POA: Diagnosis not present

## 2018-01-15 DIAGNOSIS — G4733 Obstructive sleep apnea (adult) (pediatric): Secondary | ICD-10-CM | POA: Diagnosis not present

## 2018-01-15 DIAGNOSIS — I10 Essential (primary) hypertension: Secondary | ICD-10-CM | POA: Diagnosis not present

## 2018-01-21 DIAGNOSIS — H353134 Nonexudative age-related macular degeneration, bilateral, advanced atrophic with subfoveal involvement: Secondary | ICD-10-CM | POA: Diagnosis not present

## 2018-01-21 DIAGNOSIS — H353114 Nonexudative age-related macular degeneration, right eye, advanced atrophic with subfoveal involvement: Secondary | ICD-10-CM | POA: Diagnosis not present

## 2018-01-21 DIAGNOSIS — H401133 Primary open-angle glaucoma, bilateral, severe stage: Secondary | ICD-10-CM | POA: Diagnosis not present

## 2018-01-21 DIAGNOSIS — H353222 Exudative age-related macular degeneration, left eye, with inactive choroidal neovascularization: Secondary | ICD-10-CM | POA: Diagnosis not present

## 2018-01-21 DIAGNOSIS — H353211 Exudative age-related macular degeneration, right eye, with active choroidal neovascularization: Secondary | ICD-10-CM | POA: Diagnosis not present

## 2018-01-21 DIAGNOSIS — H4052X3 Glaucoma secondary to other eye disorders, left eye, severe stage: Secondary | ICD-10-CM | POA: Diagnosis not present

## 2018-01-28 DIAGNOSIS — M545 Low back pain: Secondary | ICD-10-CM | POA: Diagnosis not present

## 2018-01-28 DIAGNOSIS — R262 Difficulty in walking, not elsewhere classified: Secondary | ICD-10-CM | POA: Diagnosis not present

## 2018-02-01 DIAGNOSIS — G4733 Obstructive sleep apnea (adult) (pediatric): Secondary | ICD-10-CM | POA: Diagnosis not present

## 2018-02-05 DIAGNOSIS — M545 Low back pain: Secondary | ICD-10-CM | POA: Diagnosis not present

## 2018-02-05 DIAGNOSIS — R262 Difficulty in walking, not elsewhere classified: Secondary | ICD-10-CM | POA: Diagnosis not present

## 2018-02-11 DIAGNOSIS — H2513 Age-related nuclear cataract, bilateral: Secondary | ICD-10-CM | POA: Diagnosis not present

## 2018-02-11 DIAGNOSIS — H353124 Nonexudative age-related macular degeneration, left eye, advanced atrophic with subfoveal involvement: Secondary | ICD-10-CM | POA: Diagnosis not present

## 2018-02-11 DIAGNOSIS — H401132 Primary open-angle glaucoma, bilateral, moderate stage: Secondary | ICD-10-CM | POA: Diagnosis not present

## 2018-02-11 DIAGNOSIS — H353211 Exudative age-related macular degeneration, right eye, with active choroidal neovascularization: Secondary | ICD-10-CM | POA: Diagnosis not present

## 2018-03-04 DIAGNOSIS — H353114 Nonexudative age-related macular degeneration, right eye, advanced atrophic with subfoveal involvement: Secondary | ICD-10-CM | POA: Diagnosis not present

## 2018-03-04 DIAGNOSIS — H401133 Primary open-angle glaucoma, bilateral, severe stage: Secondary | ICD-10-CM | POA: Diagnosis not present

## 2018-03-04 DIAGNOSIS — H353124 Nonexudative age-related macular degeneration, left eye, advanced atrophic with subfoveal involvement: Secondary | ICD-10-CM | POA: Diagnosis not present

## 2018-03-04 DIAGNOSIS — H353211 Exudative age-related macular degeneration, right eye, with active choroidal neovascularization: Secondary | ICD-10-CM | POA: Diagnosis not present

## 2018-03-04 DIAGNOSIS — G4733 Obstructive sleep apnea (adult) (pediatric): Secondary | ICD-10-CM | POA: Diagnosis not present

## 2018-03-04 DIAGNOSIS — H353134 Nonexudative age-related macular degeneration, bilateral, advanced atrophic with subfoveal involvement: Secondary | ICD-10-CM | POA: Diagnosis not present

## 2018-03-13 DIAGNOSIS — Z1231 Encounter for screening mammogram for malignant neoplasm of breast: Secondary | ICD-10-CM | POA: Diagnosis not present

## 2018-04-04 DIAGNOSIS — G4733 Obstructive sleep apnea (adult) (pediatric): Secondary | ICD-10-CM | POA: Diagnosis not present

## 2018-04-25 DIAGNOSIS — G4733 Obstructive sleep apnea (adult) (pediatric): Secondary | ICD-10-CM | POA: Diagnosis not present

## 2018-05-01 DIAGNOSIS — H353211 Exudative age-related macular degeneration, right eye, with active choroidal neovascularization: Secondary | ICD-10-CM | POA: Diagnosis not present

## 2018-05-01 DIAGNOSIS — H35352 Cystoid macular degeneration, left eye: Secondary | ICD-10-CM | POA: Diagnosis not present

## 2018-05-01 DIAGNOSIS — H35051 Retinal neovascularization, unspecified, right eye: Secondary | ICD-10-CM | POA: Diagnosis not present

## 2018-05-01 DIAGNOSIS — H3561 Retinal hemorrhage, right eye: Secondary | ICD-10-CM | POA: Diagnosis not present

## 2018-05-03 DIAGNOSIS — G4733 Obstructive sleep apnea (adult) (pediatric): Secondary | ICD-10-CM | POA: Diagnosis not present

## 2018-06-03 DIAGNOSIS — G4733 Obstructive sleep apnea (adult) (pediatric): Secondary | ICD-10-CM | POA: Diagnosis not present

## 2018-07-03 DIAGNOSIS — G4733 Obstructive sleep apnea (adult) (pediatric): Secondary | ICD-10-CM | POA: Diagnosis not present

## 2018-07-11 DIAGNOSIS — E782 Mixed hyperlipidemia: Secondary | ICD-10-CM | POA: Diagnosis not present

## 2018-07-11 DIAGNOSIS — I10 Essential (primary) hypertension: Secondary | ICD-10-CM | POA: Diagnosis not present

## 2018-07-11 DIAGNOSIS — R7301 Impaired fasting glucose: Secondary | ICD-10-CM | POA: Diagnosis not present

## 2018-07-11 DIAGNOSIS — E8881 Metabolic syndrome: Secondary | ICD-10-CM | POA: Diagnosis not present

## 2018-07-11 DIAGNOSIS — K219 Gastro-esophageal reflux disease without esophagitis: Secondary | ICD-10-CM | POA: Diagnosis not present

## 2018-07-15 DIAGNOSIS — H353114 Nonexudative age-related macular degeneration, right eye, advanced atrophic with subfoveal involvement: Secondary | ICD-10-CM | POA: Diagnosis not present

## 2018-07-15 DIAGNOSIS — R7301 Impaired fasting glucose: Secondary | ICD-10-CM | POA: Diagnosis not present

## 2018-07-15 DIAGNOSIS — K219 Gastro-esophageal reflux disease without esophagitis: Secondary | ICD-10-CM | POA: Diagnosis not present

## 2018-07-15 DIAGNOSIS — Z6833 Body mass index (BMI) 33.0-33.9, adult: Secondary | ICD-10-CM | POA: Diagnosis not present

## 2018-07-15 DIAGNOSIS — I10 Essential (primary) hypertension: Secondary | ICD-10-CM | POA: Diagnosis not present

## 2018-07-15 DIAGNOSIS — M545 Low back pain: Secondary | ICD-10-CM | POA: Diagnosis not present

## 2018-07-15 DIAGNOSIS — H353211 Exudative age-related macular degeneration, right eye, with active choroidal neovascularization: Secondary | ICD-10-CM | POA: Diagnosis not present

## 2018-07-15 DIAGNOSIS — E782 Mixed hyperlipidemia: Secondary | ICD-10-CM | POA: Diagnosis not present

## 2018-07-15 DIAGNOSIS — Z0001 Encounter for general adult medical examination with abnormal findings: Secondary | ICD-10-CM | POA: Diagnosis not present

## 2018-07-15 DIAGNOSIS — E8881 Metabolic syndrome: Secondary | ICD-10-CM | POA: Diagnosis not present

## 2018-08-03 DIAGNOSIS — G4733 Obstructive sleep apnea (adult) (pediatric): Secondary | ICD-10-CM | POA: Diagnosis not present

## 2018-09-02 DIAGNOSIS — G4733 Obstructive sleep apnea (adult) (pediatric): Secondary | ICD-10-CM | POA: Diagnosis not present

## 2018-09-09 DIAGNOSIS — H353211 Exudative age-related macular degeneration, right eye, with active choroidal neovascularization: Secondary | ICD-10-CM | POA: Diagnosis not present

## 2018-09-09 DIAGNOSIS — H353124 Nonexudative age-related macular degeneration, left eye, advanced atrophic with subfoveal involvement: Secondary | ICD-10-CM | POA: Diagnosis not present

## 2018-09-09 DIAGNOSIS — H353134 Nonexudative age-related macular degeneration, bilateral, advanced atrophic with subfoveal involvement: Secondary | ICD-10-CM | POA: Diagnosis not present

## 2018-09-09 DIAGNOSIS — H353114 Nonexudative age-related macular degeneration, right eye, advanced atrophic with subfoveal involvement: Secondary | ICD-10-CM | POA: Diagnosis not present

## 2018-09-09 DIAGNOSIS — H401133 Primary open-angle glaucoma, bilateral, severe stage: Secondary | ICD-10-CM | POA: Diagnosis not present

## 2018-09-11 DIAGNOSIS — G4733 Obstructive sleep apnea (adult) (pediatric): Secondary | ICD-10-CM | POA: Diagnosis not present

## 2018-10-03 DIAGNOSIS — G4733 Obstructive sleep apnea (adult) (pediatric): Secondary | ICD-10-CM | POA: Diagnosis not present

## 2018-11-04 DIAGNOSIS — H2513 Age-related nuclear cataract, bilateral: Secondary | ICD-10-CM | POA: Diagnosis not present

## 2018-11-04 DIAGNOSIS — H353211 Exudative age-related macular degeneration, right eye, with active choroidal neovascularization: Secondary | ICD-10-CM | POA: Diagnosis not present

## 2018-11-04 DIAGNOSIS — H353114 Nonexudative age-related macular degeneration, right eye, advanced atrophic with subfoveal involvement: Secondary | ICD-10-CM | POA: Diagnosis not present

## 2018-12-05 DIAGNOSIS — E782 Mixed hyperlipidemia: Secondary | ICD-10-CM | POA: Diagnosis not present

## 2018-12-05 DIAGNOSIS — I10 Essential (primary) hypertension: Secondary | ICD-10-CM | POA: Diagnosis not present

## 2018-12-05 DIAGNOSIS — E8881 Metabolic syndrome: Secondary | ICD-10-CM | POA: Diagnosis not present

## 2018-12-05 DIAGNOSIS — K219 Gastro-esophageal reflux disease without esophagitis: Secondary | ICD-10-CM | POA: Diagnosis not present

## 2018-12-05 DIAGNOSIS — R7301 Impaired fasting glucose: Secondary | ICD-10-CM | POA: Diagnosis not present

## 2018-12-05 DIAGNOSIS — R5383 Other fatigue: Secondary | ICD-10-CM | POA: Diagnosis not present

## 2018-12-09 DIAGNOSIS — I10 Essential (primary) hypertension: Secondary | ICD-10-CM | POA: Diagnosis not present

## 2018-12-09 DIAGNOSIS — R7301 Impaired fasting glucose: Secondary | ICD-10-CM | POA: Diagnosis not present

## 2018-12-09 DIAGNOSIS — E782 Mixed hyperlipidemia: Secondary | ICD-10-CM | POA: Diagnosis not present

## 2018-12-09 DIAGNOSIS — Z6833 Body mass index (BMI) 33.0-33.9, adult: Secondary | ICD-10-CM | POA: Diagnosis not present

## 2018-12-09 DIAGNOSIS — Z0001 Encounter for general adult medical examination with abnormal findings: Secondary | ICD-10-CM | POA: Diagnosis not present

## 2018-12-09 DIAGNOSIS — K219 Gastro-esophageal reflux disease without esophagitis: Secondary | ICD-10-CM | POA: Diagnosis not present

## 2018-12-09 DIAGNOSIS — Z23 Encounter for immunization: Secondary | ICD-10-CM | POA: Diagnosis not present

## 2018-12-09 DIAGNOSIS — E8881 Metabolic syndrome: Secondary | ICD-10-CM | POA: Diagnosis not present

## 2018-12-15 ENCOUNTER — Encounter: Payer: Self-pay | Admitting: Internal Medicine

## 2018-12-26 DIAGNOSIS — I10 Essential (primary) hypertension: Secondary | ICD-10-CM | POA: Diagnosis not present

## 2018-12-26 DIAGNOSIS — E782 Mixed hyperlipidemia: Secondary | ICD-10-CM | POA: Diagnosis not present

## 2018-12-30 ENCOUNTER — Ambulatory Visit (INDEPENDENT_AMBULATORY_CARE_PROVIDER_SITE_OTHER): Payer: Medicare HMO | Admitting: Gastroenterology

## 2018-12-30 ENCOUNTER — Other Ambulatory Visit: Payer: Self-pay

## 2018-12-30 ENCOUNTER — Encounter: Payer: Self-pay | Admitting: Gastroenterology

## 2018-12-30 DIAGNOSIS — Z86010 Personal history of colonic polyps: Secondary | ICD-10-CM

## 2018-12-30 DIAGNOSIS — Z800 Family history of malignant neoplasm of digestive organs: Secondary | ICD-10-CM

## 2018-12-30 MED ORDER — PEG 3350-KCL-NA BICARB-NACL 420 G PO SOLR: 4000.0000 mL | ORAL | 0 refills | Status: DC

## 2018-12-30 NOTE — Patient Instructions (Signed)
We have arranged a colonoscopy in the future with Dr. Gala Romney.  Have a wonderful holiday season!  It was a pleasure to see you today. I want to create trusting relationships with patients to provide genuine, compassionate, and quality care. I value your feedback. If you receive a survey regarding your visit,  I greatly appreciate you taking time to fill this out.   Annitta Needs, PhD, ANP-BC [facility] Gastroenterology

## 2018-12-30 NOTE — Progress Notes (Signed)
Primary Care Physician:  Caryl Bis, MD Primary Gastroenterologist:  Dr. Gala Romney   Chief Complaint  Patient presents with   Colonoscopy    wants to discuss d/t age    HPI:   Sabrina Cantrell is an 78 y.o. female presenting today at the request of Dr. Quillian Quince for surveillance colonoscopy. Last was in 2014 with tubular adenoma. Sister diagnosed with colon cancer at age 19.   She has no abdominal pain, change in bowel habits, rectal bleeding, dysphagia, GERD, loss of appetite, weight loss, melena, hematochezia. She is interested in a colonoscopy if benefits outweigh the risks. She would pursue further treatment if a cancer was found. She has never been hospitalized or had any surgeries.     Past Medical History:  Diagnosis Date   HTN (hypertension)    Hypercholesterolemia    Macular degeneration    Osteopenia     Past Surgical History:  Procedure Laterality Date   COLONOSCOPY N/A 01/13/2013   External hemorrhoids. 5 mm polyp (tubular adenoma) 10 cm from anal verge. Colonic diverticulosis.     Current Outpatient Medications  Medication Sig Dispense Refill   alendronate (FOSAMAX) 70 MG tablet Take 1 tablet by mouth once a week.     amLODipine (NORVASC) 5 MG tablet Take 1 tablet by mouth daily.     dorzolamide-timolol (COSOPT) 22.3-6.8 MG/ML ophthalmic solution Place 1 drop into both eyes 2 (two) times daily.     latanoprost (XALATAN) 0.005 % ophthalmic solution Place 1 drop into both eyes at bedtime.     losartan-hydrochlorothiazide (HYZAAR) 50-12.5 MG tablet Take 1 tablet by mouth daily.     NON FORMULARY Calcium    Once daily     NON FORMULARY Fiber   Once daily     simvastatin (ZOCOR) 40 MG tablet Take 40 mg by mouth every evening.     timolol (TIMOPTIC) 0.5 % ophthalmic solution Place 1 drop into both eyes daily.      peg 3350 powder (MOVIPREP) 100 G SOLR Take 1 kit (200 g total) by mouth as directed. (Patient not taking: Reported on 12/30/2018) 1  kit 0   No current facility-administered medications for this visit.     Allergies as of 12/30/2018   (No Known Allergies)    Family History  Problem Relation Age of Onset   Colon cancer Sister 48       deceased at age 37    Social History   Socioeconomic History   Marital status: Married    Spouse name: Not on file   Number of children: Not on file   Years of education: Not on file   Highest education level: Not on file  Occupational History   Occupation: retired  Scientist, product/process development strain: Not on file   Food insecurity    Worry: Not on file    Inability: Not on Lexicographer needs    Medical: Not on file    Non-medical: Not on file  Tobacco Use   Smoking status: Never Smoker   Smokeless tobacco: Never Used  Substance and Sexual Activity   Alcohol use: No   Drug use: No   Sexual activity: Not on file  Lifestyle   Physical activity    Days per week: Not on file    Minutes per session: Not on file   Stress: Not on file  Relationships   Social connections    Talks on phone: Not  on file    Gets together: Not on file    Attends religious service: Not on file    Active member of club or organization: Not on file    Attends meetings of clubs or organizations: Not on file    Relationship status: Not on file   Intimate partner violence    Fear of current or ex partner: Not on file    Emotionally abused: Not on file    Physically abused: Not on file    Forced sexual activity: Not on file  Other Topics Concern   Not on file  Social History Narrative   Not on file    Review of Systems: Gen: Denies any fever, chills, fatigue, weight loss, lack of appetite.  CV: Denies chest pain, heart palpitations, peripheral edema, syncope.  Resp: Denies shortness of breath at rest or with exertion. Denies wheezing or cough.  GI: see HPI GU : Denies urinary burning, urinary frequency, urinary hesitancy MS: Denies joint pain,  muscle weakness, cramps, or limitation of movement.  Derm: Denies rash, itching, dry skin Psych: Denies depression, anxiety, memory loss, and confusion Heme: Denies bruising, bleeding, and enlarged lymph nodes.  Physical Exam: BP (!) 145/88    Pulse 97    Temp (!) 96.6 F (35.9 C) (Temporal)    Ht 5' 3.5" (1.613 m)    Wt 196 lb 12.8 oz (89.3 kg)    BMI 34.31 kg/m  General:   Alert and oriented. Pleasant and cooperative. Well-nourished and well-developed. Appears younger than stated age.  Head:  Normocephalic and atraumatic. Eyes:  Without icterus, sclera clear and conjunctiva pink.  Ears:  Normal auditory acuity. Lungs:  Clear to auscultation bilaterally. No wheezes, rales, or rhonchi. No distress.  Heart:  S1, S2 present without murmurs appreciated.  Abdomen:  +BS, soft, non-tender and non-distended. No HSM noted. No guarding or rebound. No masses appreciated.  Rectal:  Deferred  Msk:  Symmetrical without gross deformities. Normal posture. Extremities:  Without  edema. Neurologic:  Alert and  oriented x4 Psych:  Alert and cooperative. Normal mood and affect.  ASSESSMENT: Sabrina Cantrell is an 78 y.o. female presenting today with a history of tubular adenoma in 2014, sister with colon cancer diagnosed at age 52, presenting today at request of PCP for surveillance colonoscopy. Although she is 70, she is in good health, active, and appears younger than stated age. She would like to pursue a colonoscopy at this time and understands the risks and benefits. We discussed that likely this would be the last colonoscopy, barring any unforeseen findings.    PLAN:  Proceed with TCS with Dr. Gala Romney in near future: the risks, benefits, and alternatives have been discussed with the patient in detail. The patient states understanding and desires to proceed.  Further recommendations to follow.   Annitta Needs, PhD, ANP-BC [facility] Gastroenterology

## 2019-01-01 DIAGNOSIS — H353211 Exudative age-related macular degeneration, right eye, with active choroidal neovascularization: Secondary | ICD-10-CM | POA: Diagnosis not present

## 2019-01-01 DIAGNOSIS — H353134 Nonexudative age-related macular degeneration, bilateral, advanced atrophic with subfoveal involvement: Secondary | ICD-10-CM | POA: Diagnosis not present

## 2019-01-01 DIAGNOSIS — H401133 Primary open-angle glaucoma, bilateral, severe stage: Secondary | ICD-10-CM | POA: Diagnosis not present

## 2019-01-01 DIAGNOSIS — H353114 Nonexudative age-related macular degeneration, right eye, advanced atrophic with subfoveal involvement: Secondary | ICD-10-CM | POA: Diagnosis not present

## 2019-01-01 DIAGNOSIS — H353124 Nonexudative age-related macular degeneration, left eye, advanced atrophic with subfoveal involvement: Secondary | ICD-10-CM | POA: Diagnosis not present

## 2019-01-01 DIAGNOSIS — H353222 Exudative age-related macular degeneration, left eye, with inactive choroidal neovascularization: Secondary | ICD-10-CM | POA: Diagnosis not present

## 2019-03-04 ENCOUNTER — Telehealth: Payer: Self-pay | Admitting: Internal Medicine

## 2019-03-04 ENCOUNTER — Telehealth: Payer: Self-pay

## 2019-03-04 NOTE — Telephone Encounter (Signed)
Called and informed pt Miralax instructions mailed.

## 2019-03-04 NOTE — Telephone Encounter (Signed)
PA for TCS submitted via [employer] website. Case went to clinical review. [place] tracking# [id number]. Clinical notes faxed to [facility].

## 2019-03-04 NOTE — Telephone Encounter (Signed)
PATIENT CALLED AND SAID THAT HER PREP IS ON BACK ORDER [phone]

## 2019-03-05 ENCOUNTER — Other Ambulatory Visit: Payer: Self-pay

## 2019-03-05 NOTE — Telephone Encounter (Signed)
TCS approved. [employer]# [id number], valid 03/18/19-04/17/19.

## 2019-03-10 DIAGNOSIS — H353211 Exudative age-related macular degeneration, right eye, with active choroidal neovascularization: Secondary | ICD-10-CM | POA: Diagnosis not present

## 2019-03-10 DIAGNOSIS — H353124 Nonexudative age-related macular degeneration, left eye, advanced atrophic with subfoveal involvement: Secondary | ICD-10-CM | POA: Diagnosis not present

## 2019-03-10 DIAGNOSIS — H353134 Nonexudative age-related macular degeneration, bilateral, advanced atrophic with subfoveal involvement: Secondary | ICD-10-CM | POA: Diagnosis not present

## 2019-03-10 DIAGNOSIS — H401133 Primary open-angle glaucoma, bilateral, severe stage: Secondary | ICD-10-CM | POA: Diagnosis not present

## 2019-03-10 DIAGNOSIS — H353114 Nonexudative age-related macular degeneration, right eye, advanced atrophic with subfoveal involvement: Secondary | ICD-10-CM | POA: Diagnosis not present

## 2019-03-10 DIAGNOSIS — H353222 Exudative age-related macular degeneration, left eye, with inactive choroidal neovascularization: Secondary | ICD-10-CM | POA: Diagnosis not present

## 2019-03-16 ENCOUNTER — Other Ambulatory Visit (HOSPITAL_COMMUNITY)
Admission: RE | Admit: 2019-03-16 | Discharge: 2019-03-16 | Disposition: A | Discharge status: Home / Self Care | Payer: Medicare HMO | Source: Ambulatory Visit | Attending: Internal Medicine | Admitting: Internal Medicine

## 2019-03-16 ENCOUNTER — Other Ambulatory Visit: Payer: Self-pay

## 2019-03-16 ENCOUNTER — Telehealth: Payer: Self-pay

## 2019-03-16 MED ORDER — PEG 3350-KCL-NA BICARB-NACL 420 G PO SOLR: 4000.0000 mL | ORAL | 0 refills | Status: DC

## 2019-03-16 NOTE — Telephone Encounter (Signed)
Pt called office and LMOVM requesting to cancel TCS scheduled for 03/18/19. She isn't comfortable having procedure at this time d/t COVID and she plans to receive COVID vaccine 03/18/19. Wants to wait to have procedure after getting vaccine.  Tried to call pt, no answer, LMOAM for return call.

## 2019-03-16 NOTE — Telephone Encounter (Signed)
Returning a call.  [phone]

## 2019-03-16 NOTE — Telephone Encounter (Signed)
Called pt, TCS rescheduled to 06/05/19 at 10:30am. COVID test 06/03/19 at 10:00am. LMOVM to inform endo scheduler. Unable to cancel COVID test scheduled for today as pt has been arrived. Endo scheduler informed.  Rx for Tri-Lyte resent to pharmacy, pt aware. Appt letter mailed with procedure instructions.

## 2019-03-16 NOTE — Telephone Encounter (Signed)
Called [employer] to change date of service for PA of TCS. Call was transferred to [facility]. Spoke to [employer]. Date of service for TCS changed to 06/05/19. Auth# [id number], valid 06/05/19-07/05/19.

## 2019-03-27 DIAGNOSIS — E7849 Other hyperlipidemia: Secondary | ICD-10-CM | POA: Diagnosis not present

## 2019-03-27 DIAGNOSIS — I10 Essential (primary) hypertension: Secondary | ICD-10-CM | POA: Diagnosis not present

## 2019-04-24 DIAGNOSIS — I10 Essential (primary) hypertension: Secondary | ICD-10-CM | POA: Diagnosis not present

## 2019-04-24 DIAGNOSIS — E7849 Other hyperlipidemia: Secondary | ICD-10-CM | POA: Diagnosis not present

## 2019-05-05 DIAGNOSIS — Z1231 Encounter for screening mammogram for malignant neoplasm of breast: Secondary | ICD-10-CM | POA: Diagnosis not present

## 2019-05-13 ENCOUNTER — Other Ambulatory Visit: Payer: Self-pay | Admitting: Family Medicine

## 2019-05-13 DIAGNOSIS — R928 Other abnormal and inconclusive findings on diagnostic imaging of breast: Secondary | ICD-10-CM | POA: Diagnosis not present

## 2019-05-13 DIAGNOSIS — N6489 Other specified disorders of breast: Secondary | ICD-10-CM

## 2019-05-20 ENCOUNTER — Ambulatory Visit
Admission: RE | Admit: 2019-05-20 | Discharge: 2019-05-20 | Disposition: A | Discharge status: Home / Self Care | Payer: Medicare HMO | Source: Ambulatory Visit | Attending: Family Medicine | Admitting: Family Medicine

## 2019-05-20 ENCOUNTER — Other Ambulatory Visit: Payer: Self-pay

## 2019-05-20 DIAGNOSIS — N6489 Other specified disorders of breast: Secondary | ICD-10-CM

## 2019-05-20 DIAGNOSIS — N6012 Diffuse cystic mastopathy of left breast: Secondary | ICD-10-CM | POA: Diagnosis not present

## 2019-05-20 DIAGNOSIS — R928 Other abnormal and inconclusive findings on diagnostic imaging of breast: Secondary | ICD-10-CM | POA: Diagnosis not present

## 2019-05-26 DIAGNOSIS — H353211 Exudative age-related macular degeneration, right eye, with active choroidal neovascularization: Secondary | ICD-10-CM | POA: Diagnosis not present

## 2019-05-26 DIAGNOSIS — H353222 Exudative age-related macular degeneration, left eye, with inactive choroidal neovascularization: Secondary | ICD-10-CM | POA: Diagnosis not present

## 2019-05-26 DIAGNOSIS — H353134 Nonexudative age-related macular degeneration, bilateral, advanced atrophic with subfoveal involvement: Secondary | ICD-10-CM | POA: Diagnosis not present

## 2019-05-26 DIAGNOSIS — H35051 Retinal neovascularization, unspecified, right eye: Secondary | ICD-10-CM | POA: Diagnosis not present

## 2019-05-26 DIAGNOSIS — H353124 Nonexudative age-related macular degeneration, left eye, advanced atrophic with subfoveal involvement: Secondary | ICD-10-CM | POA: Diagnosis not present

## 2019-05-27 DIAGNOSIS — E7849 Other hyperlipidemia: Secondary | ICD-10-CM | POA: Diagnosis not present

## 2019-05-27 DIAGNOSIS — I10 Essential (primary) hypertension: Secondary | ICD-10-CM | POA: Diagnosis not present

## 2019-06-02 ENCOUNTER — Other Ambulatory Visit (HOSPITAL_COMMUNITY)
Admission: RE | Admit: 2019-06-02 | Discharge: 2019-06-02 | Disposition: A | Discharge status: Home / Self Care | Payer: Medicare HMO | Source: Ambulatory Visit | Attending: Internal Medicine | Admitting: Internal Medicine

## 2019-06-02 ENCOUNTER — Other Ambulatory Visit: Payer: Self-pay

## 2019-06-02 DIAGNOSIS — Z20822 Contact with and (suspected) exposure to covid-19: Secondary | ICD-10-CM | POA: Diagnosis not present

## 2019-06-02 DIAGNOSIS — Z01812 Encounter for preprocedural laboratory examination: Secondary | ICD-10-CM | POA: Insufficient documentation

## 2019-06-03 ENCOUNTER — Other Ambulatory Visit (HOSPITAL_COMMUNITY): Payer: Medicare HMO

## 2019-06-03 LAB — SARS CORONAVIRUS 2 (TAT 6-24 HRS): SARS Coronavirus 2: NEGATIVE

## 2019-06-05 ENCOUNTER — Other Ambulatory Visit: Payer: Self-pay

## 2019-06-05 ENCOUNTER — Ambulatory Visit (HOSPITAL_COMMUNITY)
Admission: RE | Admit: 2019-06-05 | Discharge: 2019-06-05 | Disposition: A | Discharge status: Home / Self Care | Payer: Medicare HMO | Attending: Internal Medicine | Admitting: Internal Medicine

## 2019-06-05 ENCOUNTER — Encounter (HOSPITAL_COMMUNITY)
Admission: RE | Disposition: A | Discharge status: Home / Self Care | Payer: Self-pay | Source: Home / Self Care | Attending: Internal Medicine

## 2019-06-05 ENCOUNTER — Encounter (HOSPITAL_COMMUNITY): Payer: Self-pay | Admitting: Internal Medicine

## 2019-06-05 DIAGNOSIS — I10 Essential (primary) hypertension: Secondary | ICD-10-CM | POA: Diagnosis not present

## 2019-06-05 DIAGNOSIS — Z7983 Long term (current) use of bisphosphonates: Secondary | ICD-10-CM | POA: Diagnosis not present

## 2019-06-05 DIAGNOSIS — K5730 Diverticulosis of large intestine without perforation or abscess without bleeding: Secondary | ICD-10-CM | POA: Insufficient documentation

## 2019-06-05 DIAGNOSIS — Z1211 Encounter for screening for malignant neoplasm of colon: Secondary | ICD-10-CM | POA: Insufficient documentation

## 2019-06-05 DIAGNOSIS — H3530 Unspecified macular degeneration: Secondary | ICD-10-CM | POA: Insufficient documentation

## 2019-06-05 DIAGNOSIS — E7800 Pure hypercholesterolemia, unspecified: Secondary | ICD-10-CM | POA: Diagnosis not present

## 2019-06-05 DIAGNOSIS — Z800 Family history of malignant neoplasm of digestive organs: Secondary | ICD-10-CM | POA: Insufficient documentation

## 2019-06-05 DIAGNOSIS — Z79899 Other long term (current) drug therapy: Secondary | ICD-10-CM | POA: Diagnosis not present

## 2019-06-05 DIAGNOSIS — Z86010 Personal history of colonic polyps: Secondary | ICD-10-CM | POA: Insufficient documentation

## 2019-06-05 HISTORY — PX: COLONOSCOPY: SHX5424

## 2019-06-05 SURGERY — COLONOSCOPY
Anesthesia: Moderate Sedation

## 2019-06-05 MED ORDER — MEPERIDINE HCL 100 MG/ML IJ SOLN
INTRAMUSCULAR | Status: DC | PRN
  Administered 2019-06-05: 25 mg

## 2019-06-05 MED ORDER — MIDAZOLAM HCL 5 MG/5ML IJ SOLN
INTRAMUSCULAR | Status: AC
  Filled 2019-06-05: qty 5

## 2019-06-05 MED ORDER — ONDANSETRON HCL 4 MG/2ML IJ SOLN
INTRAMUSCULAR | Status: DC | PRN
  Administered 2019-06-05: 4 mg via INTRAVENOUS

## 2019-06-05 MED ORDER — ONDANSETRON HCL 4 MG/2ML IJ SOLN
INTRAMUSCULAR | Status: AC
  Filled 2019-06-05: qty 2

## 2019-06-05 MED ORDER — MEPERIDINE HCL 50 MG/ML IJ SOLN
INTRAMUSCULAR | Status: AC
  Filled 2019-06-05: qty 1

## 2019-06-05 MED ORDER — MIDAZOLAM HCL 5 MG/5ML IJ SOLN
INTRAMUSCULAR | Status: DC | PRN
  Administered 2019-06-05: 1 mg via INTRAVENOUS
  Administered 2019-06-05: 2 mg via INTRAVENOUS

## 2019-06-05 MED ORDER — SODIUM CHLORIDE 0.9 % IV SOLN: INTRAVENOUS | Status: DC

## 2019-06-05 NOTE — H&P (Signed)
@LOGO @   Primary Care Physician:  Caryl Bis, MD Primary Gastroenterologist:  Dr. Gala Romney  Pre-Procedure History & Physical: HPI:  Sabrina Cantrell is a 79 y.o. female here for for surveillance colonoscopy.  Positive family history of colon cancer in her sister as well.  Long discussion in the office and here at the bedside today about 1 more colonoscopy.  Her health remains relatively good for 79 years of age.  Patient driving 1 more colonoscopy.  It is not unreasonable.  Past Medical History:  Diagnosis Date  . HTN (hypertension)   . Hypercholesterolemia   . Macular degeneration   . Osteopenia     Past Surgical History:  Procedure Laterality Date  . COLONOSCOPY N/A 01/13/2013   External hemorrhoids. 5 mm polyp (tubular adenoma) 10 cm from anal verge. Colonic diverticulosis.     Prior to Admission medications   Medication Sig Start Date End Date Taking? Authorizing Provider  acetaminophen (TYLENOL) 500 MG tablet Take 500 mg by mouth every 6 (six) hours as needed for moderate pain.   Yes [provider]  alendronate (FOSAMAX) 70 MG tablet Take 70 mg by mouth every Monday.  12/24/18  Yes [provider]  amLODipine (NORVASC) 5 MG tablet Take 5 mg by mouth daily.  11/09/18  Yes [provider]  Calcium Carb-Cholecalciferol (CALCIUM 600 + D PO) Take 1 tablet by mouth daily.   Yes [provider]  dorzolamide-timolol (COSOPT) 22.3-6.8 MG/ML ophthalmic solution Place 1 drop into both eyes 2 (two) times daily. 08/19/18  Yes [provider]  FIBER PO Take 2 capsules by mouth daily.   Yes [provider]  latanoprost (XALATAN) 0.005 % ophthalmic solution Place 1 drop into both eyes at bedtime. 11/13/18  Yes [provider]  losartan-hydrochlorothiazide (HYZAAR) 50-12.5 MG tablet Take 1 tablet by mouth daily. 11/06/18  Yes [provider]  Melatonin 5 MG CAPS Take 5 mg by mouth at bedtime.   Yes [provider]   Multiple Vitamins-Minerals (PRESERVISION AREDS 2) CAPS Take 1 capsule by mouth 2 (two) times daily.   Yes [provider]  polyethylene glycol-electrolytes (TRILYTE) 420 g solution Take 4,000 mLs by mouth as directed. 03/16/19  Yes Kennidee Heyne, [name], MD  simvastatin (ZOCOR) 40 MG tablet Take 40 mg by mouth every evening.   Yes [provider]    Allergies as of 12/30/2018  . (No Known Allergies)    Family History  Problem Relation Age of Onset  . Colon cancer Sister 63       deceased at age 31    Social History   Socioeconomic History  . Marital status: Married    Spouse name: Not on file  . Number of children: Not on file  . Years of education: Not on file  . Highest education level: Not on file  Occupational History  . Occupation: retired  Tobacco Use  . Smoking status: Never Smoker  . Smokeless tobacco: Never Used  Substance and Sexual Activity  . Alcohol use: No  . Drug use: No  . Sexual activity: Not on file  Other Topics Concern  . Not on file  Social History Narrative  . Not on file   Social Determinants of Health   Financial Resource Strain:   . Difficulty of Paying Living Expenses:   Food Insecurity:   . Worried About Charity fundraiser in the Last Year:   . [place] in the Last Year:  Transportation Needs:   . Film/video editor (Medical):   Marland Kitchen Lack of Transportation (Non-Medical):   Physical Activity:   . Days of Exercise per Week:   . Minutes of Exercise per Session:   Stress:   . Feeling of Stress :   Social Connections:   . Frequency of Communication with Friends and Family:   . Frequency of Social Gatherings with Friends and Family:   . Attends Religious Services:   . Active Member of Clubs or Organizations:   . Attends Archivist Meetings:   Marland Kitchen Marital Status:   Intimate Partner Violence:   . Fear of Current or Ex-Partner:   . Emotionally Abused:   Marland Kitchen Physically Abused:   . Sexually Abused:      Review of Systems: See HPI, otherwise negative ROS  Physical Exam: BP (!) 146/68   Pulse 81   Temp 98.9 F (37.2 C) (Oral)   Resp 15   Ht 5' 3.5" (1.613 m)   SpO2 95%   BMI 34.31 kg/m  General:   Alert,  Well-developed, well-nourished, pleasant and cooperative in NAD Neck:  Supple; no masses or thyromegaly. No significant cervical adenopathy. Lungs:  Clear throughout to auscultation.   No wheezes, crackles, or rhonchi. No acute distress. Heart:  Regular rate and rhythm; no murmurs, clicks, rubs,  or gallops. Abdomen: Non-distended, normal bowel sounds.  Soft and nontender without appreciable mass or hepatosplenomegaly.  Pulses:  Normal pulses noted. Extremities:  Without clubbing or edema.  Impression/Plan: 79 year old lady with a positive family history of colon cancer and a personal history of colonic polyps.  Desirous of one more colonoscopy per plan.  I have offered the patient a surveillance colonoscopy today.  The risks, benefits, limitations, alternatives and imponderables have been reviewed with the patient. Questions have been answered. All parties are agreeable.      Notice: This dictation was prepared with Dragon dictation along with smaller phrase technology. Any transcriptional errors that result from this process are unintentional and may not be corrected upon review.

## 2019-06-05 NOTE — Discharge Instructions (Signed)
Colonoscopy Discharge Instructions  Read the instructions outlined below and refer to this sheet in the next few weeks. These discharge instructions provide you with general information on caring for yourself after you leave the hospital. Your doctor may also give you specific instructions. While your treatment has been planned according to the most current medical practices available, unavoidable complications occasionally occur. If you have any problems or questions after discharge, call Dr. Gala Romney at [phone]. ACTIVITY  You may resume your regular activity, but move at a slower pace for the next 24 hours.   Take frequent rest periods for the next 24 hours.   Walking will help get rid of the air and reduce the bloated feeling in your belly (abdomen).   No driving for 24 hours (because of the medicine (anesthesia) used during the test).    Do not sign any important legal documents or operate any machinery for 24 hours (because of the anesthesia used during the test).  NUTRITION  Drink plenty of fluids.   You may resume your normal diet as instructed by your doctor.   Begin with a light meal and progress to your normal diet. Heavy or fried foods are harder to digest and may make you feel sick to your stomach (nauseated).   Avoid alcoholic beverages for 24 hours or as instructed.  MEDICATIONS  You may resume your normal medications unless your doctor tells you otherwise.  WHAT YOU CAN EXPECT TODAY  Some feelings of bloating in the abdomen.   Passage of more gas than usual.   Spotting of blood in your stool or on the toilet paper.  IF YOU HAD POLYPS REMOVED DURING THE COLONOSCOPY:  No aspirin products for 7 days or as instructed.   No alcohol for 7 days or as instructed.   Eat a soft diet for the next 24 hours.  FINDING OUT THE RESULTS OF YOUR TEST Not all test results are available during your visit. If your test results are not back during the visit, make an appointment  with your caregiver to find out the results. Do not assume everything is normal if you have not heard from your caregiver or the medical facility. It is important for you to follow up on all of your test results.  SEEK IMMEDIATE MEDICAL ATTENTION IF:  You have more than a spotting of blood in your stool.   Your belly is swollen (abdominal distention).   You are nauseated or vomiting.   You have a temperature over 101.   You have abdominal pain or discomfort that is severe or gets worse throughout the day.    Diverticulosis  Diverticulosis is a condition that develops when small pouches (diverticula) form in the wall of the large intestine (colon). The colon is where water is absorbed and stool (feces) is formed. The pouches form when the inside layer of the colon pushes through weak spots in the outer layers of the colon. You may have a few pouches or many of them. The pouches usually do not cause problems unless they become inflamed or infected. When this happens, the condition is called diverticulitis. What are the causes? The cause of this condition is not known. What increases the risk? The following factors may make you more likely to develop this condition:  Being older than age 36. Your risk for this condition increases with age. Diverticulosis is rare among people younger than age 78. By age 39, many people have it.  Eating a low-fiber diet.  Having  frequent constipation.  Being overweight.  Not getting enough exercise.  Smoking.  Taking over-the-counter pain medicines, like aspirin and ibuprofen.  Having a family history of diverticulosis. What are the signs or symptoms? In most people, there are no symptoms of this condition. If you do have symptoms, they may include:  Bloating.  Cramps in the abdomen.  Constipation or diarrhea.  Pain in the lower left side of the abdomen. How is this diagnosed? Because diverticulosis usually has no symptoms, it is most  often diagnosed during an exam for other colon problems. The condition may be diagnosed by:  Using a flexible scope to examine the colon (colonoscopy).  Taking an X-ray of the colon after dye has been put into the colon (barium enema).  Having a CT scan. How is this treated? You may not need treatment for this condition. Your health care provider may recommend treatment to prevent problems. You may need treatment if you have symptoms or if you previously had diverticulitis. Treatment may include:  Eating a high-fiber diet.  Taking a fiber supplement.  Taking a live bacteria supplement (probiotic).  Taking medicine to relax your colon. Follow these instructions at home: Medicines  Take over-the-counter and prescription medicines only as told by your health care provider.  If told by your health care provider, take a fiber supplement or probiotic. Constipation prevention Your condition may cause constipation. To prevent or treat constipation, you may need to:  Drink enough fluid to keep your urine pale yellow.  Take over-the-counter or prescription medicines.  Eat foods that are high in fiber, such as beans, whole grains, and fresh fruits and vegetables.  Limit foods that are high in fat and processed sugars, such as fried or sweet foods.  General instructions  Try not to strain when you have a bowel movement.  Keep all follow-up visits as told by your health care provider. This is important. Contact a health care provider if you:  Have pain in your abdomen.  Have bloating.  Have cramps.  Have not had a bowel movement in 3 days. Get help right away if:  Your pain gets worse.  Your bloating becomes very bad.  You have a fever or chills, and your symptoms suddenly get worse.  You vomit.  You have bowel movements that are bloody or black.  You have bleeding from your rectum. Summary  Diverticulosis is a condition that develops when small pouches (diverticula)  form in the wall of the large intestine (colon).  You may have a few pouches or many of them.  This condition is most often diagnosed during an exam for other colon problems.  Treatment may include increasing the fiber in your diet, taking supplements, or taking medicines. This information is not intended to replace advice given to you by your health care provider. Make sure you discuss any questions you have with your health care provider. Document Revised: 09/11/2018 Document Reviewed: 09/11/2018 Elsevier Patient Education  [place].   Diverticulosis information provided  No sign of polyps or tumor found today  I do not recommend a future colonoscopy unless new symptoms develop  At patient request I called Herbie Baltimore at [phone] answer

## 2019-06-05 NOTE — Op Note (Signed)
[facility] Patient Name: Sabrina Cantrell Procedure Date: 06/05/2019 10:10 AM MRN: [id number] Date of Birth: [birth year] Attending MD: Norvel Richards , MD CSN: [id number] Age: 79 Admit Type: Outpatient Procedure:                Colonoscopy Indications:              High risk colon cancer surveillance: Personal                            history of colonic polyps Providers:                Norvel Richards, MD, Janeece Riggers, RN, Randa Spike, Technician Referring MD:              Medicines:                Midazolam 3 mg IV, Meperidine 25 mg IV, Ondansetron                            4 mg IV Complications:            No immediate complications. Estimated Blood Loss:     Estimated blood loss: none. Procedure:                Pre-Anesthesia Assessment:                           - Prior to the procedure, a History and Physical                            was performed, and patient medications and                            allergies were reviewed. The patient's tolerance of                            previous anesthesia was also reviewed. The risks                            and benefits of the procedure and the sedation                            options and risks were discussed with the patient.                            All questions were answered, and informed consent                            was obtained. Prior Anticoagulants: The patient has                            taken no previous anticoagulant or antiplatelet  agents. ASA Grade Assessment: II - A patient with                            mild systemic disease. After reviewing the risks                            and benefits, the patient was deemed in                            satisfactory condition to undergo the procedure.                           After obtaining informed consent, the colonoscope                            was passed under direct vision.  Throughout the                            procedure, the patient's blood pressure, pulse, and                            oxygen saturations were monitored continuously. The                            [id number]) scope was introduced through                            the anus and advanced to the the cecum, identified                            by appendiceal orifice and ileocecal valve. The                            colonoscopy was performed without difficulty. The                            patient tolerated the procedure well. The quality                            of the bowel preparation was adequate. Scope In: 10:26:44 AM Scope Out: 10:37:49 AM Scope Withdrawal Time: 0 hours 6 minutes 17 seconds  Total Procedure Duration: 0 hours 11 minutes 5 seconds  Findings:      The perianal and digital rectal examinations were normal.      Scattered medium-mouthed diverticula were found in the sigmoid colon and       descending colon.      The exam was otherwise without abnormality on direct and retroflexion       views. Impression:               - Diverticulosis in the sigmoid colon and in the                            descending colon.                           -  The examination was otherwise normal on direct                            and retroflexion views.                           - No specimens collected. Moderate Sedation:      Moderate (conscious) sedation was administered by the endoscopy nurse       and supervised by the endoscopist. The following parameters were       monitored: oxygen saturation, heart rate, blood pressure, respiratory       rate, EKG, adequacy of pulmonary ventilation, and response to care.       Total physician intraservice time was 15 minutes. Recommendation:           - Patient has a contact number available for                            emergencies. The signs and symptoms of potential                            delayed complications were  discussed with the                            patient. Return to normal activities tomorrow.                            Written discharge instructions were provided to the                            patient.                           - Resume previous diet.                           - Continue present medications.                           - No repeat colonoscopy due to age.                           - Return to GI clinic PRN. Procedure Code(s):        --- Professional ---                           [id number], Colonoscopy, flexible; diagnostic, including                            collection of specimen(s) by brushing or washing,                            when performed (separate procedure)                           G0500, Moderate sedation services provided by the  same physician or other qualified health care                            professional performing a gastrointestinal                            endoscopic service that sedation supports,                            requiring the presence of an independent trained                            observer to assist in the monitoring of the                            patient's level of consciousness and physiological                            status; initial 15 minutes of intra-service time;                            patient age 8 years or older (additional time may                            be reported with [phone], as appropriate) Diagnosis Code(s):        --- Professional ---                           Z86.010, Personal history of colonic polyps                           K57.30, Diverticulosis of large intestine without                            perforation or abscess without bleeding CPT copyright 2019 American Medical Association. All rights reserved. The codes documented in this report are preliminary and upon coder review may  be revised to meet current compliance requirements. [name]. [name], MD Norvel Richards, MD 06/05/2019 10:44:42 AM This report has been signed electronically. Number of Addenda: 0

## 2019-06-08 DIAGNOSIS — R7301 Impaired fasting glucose: Secondary | ICD-10-CM | POA: Diagnosis not present

## 2019-06-08 DIAGNOSIS — E782 Mixed hyperlipidemia: Secondary | ICD-10-CM | POA: Diagnosis not present

## 2019-06-08 DIAGNOSIS — K219 Gastro-esophageal reflux disease without esophagitis: Secondary | ICD-10-CM | POA: Diagnosis not present

## 2019-06-08 DIAGNOSIS — I10 Essential (primary) hypertension: Secondary | ICD-10-CM | POA: Diagnosis not present

## 2019-06-11 DIAGNOSIS — E782 Mixed hyperlipidemia: Secondary | ICD-10-CM | POA: Diagnosis not present

## 2019-06-11 DIAGNOSIS — R7301 Impaired fasting glucose: Secondary | ICD-10-CM | POA: Diagnosis not present

## 2019-06-11 DIAGNOSIS — G4733 Obstructive sleep apnea (adult) (pediatric): Secondary | ICD-10-CM | POA: Diagnosis not present

## 2019-06-11 DIAGNOSIS — M545 Low back pain: Secondary | ICD-10-CM | POA: Diagnosis not present

## 2019-06-11 DIAGNOSIS — E8881 Metabolic syndrome: Secondary | ICD-10-CM | POA: Diagnosis not present

## 2019-06-11 DIAGNOSIS — I10 Essential (primary) hypertension: Secondary | ICD-10-CM | POA: Diagnosis not present

## 2019-06-11 DIAGNOSIS — Z23 Encounter for immunization: Secondary | ICD-10-CM | POA: Diagnosis not present

## 2019-06-11 DIAGNOSIS — K219 Gastro-esophageal reflux disease without esophagitis: Secondary | ICD-10-CM | POA: Diagnosis not present

## 2019-06-26 DIAGNOSIS — E7849 Other hyperlipidemia: Secondary | ICD-10-CM | POA: Diagnosis not present

## 2019-06-26 DIAGNOSIS — I10 Essential (primary) hypertension: Secondary | ICD-10-CM | POA: Diagnosis not present

## 2019-06-30 DIAGNOSIS — H353211 Exudative age-related macular degeneration, right eye, with active choroidal neovascularization: Secondary | ICD-10-CM | POA: Diagnosis not present

## 2019-06-30 DIAGNOSIS — H401132 Primary open-angle glaucoma, bilateral, moderate stage: Secondary | ICD-10-CM | POA: Diagnosis not present

## 2019-06-30 DIAGNOSIS — H2513 Age-related nuclear cataract, bilateral: Secondary | ICD-10-CM | POA: Diagnosis not present

## 2019-06-30 DIAGNOSIS — H353124 Nonexudative age-related macular degeneration, left eye, advanced atrophic with subfoveal involvement: Secondary | ICD-10-CM | POA: Diagnosis not present

## 2019-07-27 DIAGNOSIS — R7301 Impaired fasting glucose: Secondary | ICD-10-CM | POA: Diagnosis not present

## 2019-07-27 DIAGNOSIS — K219 Gastro-esophageal reflux disease without esophagitis: Secondary | ICD-10-CM | POA: Diagnosis not present

## 2019-07-27 DIAGNOSIS — I10 Essential (primary) hypertension: Secondary | ICD-10-CM | POA: Diagnosis not present

## 2019-07-27 DIAGNOSIS — E7849 Other hyperlipidemia: Secondary | ICD-10-CM | POA: Diagnosis not present

## 2019-08-25 ENCOUNTER — Encounter (INDEPENDENT_AMBULATORY_CARE_PROVIDER_SITE_OTHER): Payer: Medicare HMO | Admitting: Ophthalmology

## 2019-08-26 DIAGNOSIS — K219 Gastro-esophageal reflux disease without esophagitis: Secondary | ICD-10-CM | POA: Diagnosis not present

## 2019-08-26 DIAGNOSIS — E7849 Other hyperlipidemia: Secondary | ICD-10-CM | POA: Diagnosis not present

## 2019-08-26 DIAGNOSIS — I10 Essential (primary) hypertension: Secondary | ICD-10-CM | POA: Diagnosis not present

## 2019-08-26 DIAGNOSIS — R7301 Impaired fasting glucose: Secondary | ICD-10-CM | POA: Diagnosis not present

## 2019-08-27 ENCOUNTER — Ambulatory Visit (INDEPENDENT_AMBULATORY_CARE_PROVIDER_SITE_OTHER): Payer: Medicare HMO | Admitting: Ophthalmology

## 2019-08-27 ENCOUNTER — Encounter (INDEPENDENT_AMBULATORY_CARE_PROVIDER_SITE_OTHER): Payer: Self-pay | Admitting: Ophthalmology

## 2019-08-27 ENCOUNTER — Other Ambulatory Visit: Payer: Self-pay

## 2019-08-27 DIAGNOSIS — H353124 Nonexudative age-related macular degeneration, left eye, advanced atrophic with subfoveal involvement: Secondary | ICD-10-CM | POA: Insufficient documentation

## 2019-08-27 DIAGNOSIS — H401133 Primary open-angle glaucoma, bilateral, severe stage: Secondary | ICD-10-CM | POA: Diagnosis not present

## 2019-08-27 DIAGNOSIS — H353211 Exudative age-related macular degeneration, right eye, with active choroidal neovascularization: Secondary | ICD-10-CM | POA: Diagnosis not present

## 2019-08-27 DIAGNOSIS — H353114 Nonexudative age-related macular degeneration, right eye, advanced atrophic with subfoveal involvement: Secondary | ICD-10-CM | POA: Diagnosis not present

## 2019-08-27 DIAGNOSIS — H353212 Exudative age-related macular degeneration, right eye, with inactive choroidal neovascularization: Secondary | ICD-10-CM | POA: Insufficient documentation

## 2019-08-27 MED ORDER — BEVACIZUMAB CHEMO INJECTION 1.25MG/0.05ML SYRINGE FOR KALEIDOSCOPE
1.2500 mg | INTRAVITREAL | Status: AC | PRN
  Administered 2019-08-27: 1.25 mg via INTRAVITREAL

## 2019-08-27 NOTE — Progress Notes (Signed)
08/27/2019     CHIEF COMPLAINT Patient presents for Retina Follow Up   HISTORY OF PRESENT ILLNESS: Sabrina Cantrell is a 79 y.o. female who presents to the clinic today for:   HPI    Retina Follow Up    Patient presents with  Wet AMD.  In right eye.  Severity is moderate.  Duration of 3 months.  Since onset it is stable.  I, the attending physician,  performed the HPI with the patient and updated documentation appropriately.          Comments    3 Month AMD f\u OU. Possible Avastin OD. OCT  Pt states vision is stable. Using gtts as directed. Pt saw Dr. Katy Fitch about 2 months ago.       Last edited by Tilda Franco on 08/27/2019  9:30 AM. (History)      Referring physician: Caryl Bis, MD [place],  [place] [address]  HISTORICAL INFORMATION:   Selected notes from the MEDICAL RECORD NUMBER       CURRENT MEDICATIONS: Current Outpatient Medications (Ophthalmic Drugs)  Medication Sig  . dorzolamide-timolol (COSOPT) 22.3-6.8 MG/ML ophthalmic solution Place 1 drop into both eyes 2 (two) times daily.  Marland Kitchen latanoprost (XALATAN) 0.005 % ophthalmic solution Place 1 drop into both eyes at bedtime.   No current facility-administered medications for this visit. (Ophthalmic Drugs)   Current Outpatient Medications (Other)  Medication Sig  . acetaminophen (TYLENOL) 500 MG tablet Take 500 mg by mouth every 6 (six) hours as needed for moderate pain.  Marland Kitchen alendronate (FOSAMAX) 70 MG tablet Take 70 mg by mouth every Monday.   Marland Kitchen amLODipine (NORVASC) 5 MG tablet Take 5 mg by mouth daily.   . Calcium Carb-Cholecalciferol (CALCIUM 600 + D PO) Take 1 tablet by mouth daily.  Marland Kitchen FIBER PO Take 2 capsules by mouth daily.  Marland Kitchen losartan-hydrochlorothiazide (HYZAAR) 50-12.5 MG tablet Take 1 tablet by mouth daily.  . Melatonin 5 MG CAPS Take 5 mg by mouth at bedtime.  . Multiple Vitamins-Minerals (PRESERVISION AREDS 2) CAPS Take 1 capsule by mouth 2 (two) times daily.  . polyethylene  glycol-electrolytes (TRILYTE) 420 g solution Take 4,000 mLs by mouth as directed.  . simvastatin (ZOCOR) 40 MG tablet Take 40 mg by mouth every evening.   No current facility-administered medications for this visit. (Other)      REVIEW OF SYSTEMS:    ALLERGIES No Known Allergies  PAST MEDICAL HISTORY Past Medical History:  Diagnosis Date  . HTN (hypertension)   . Hypercholesterolemia   . Macular degeneration   . Osteopenia    Past Surgical History:  Procedure Laterality Date  . COLONOSCOPY N/A 01/13/2013   External hemorrhoids. 5 mm polyp (tubular adenoma) 10 cm from anal verge. Colonic diverticulosis.   . COLONOSCOPY N/A 06/05/2019   Procedure: COLONOSCOPY;  Surgeon: Daneil Dolin, MD;  Location: [facility];  Service: Endoscopy;  Laterality: N/A;  12:00pm    FAMILY HISTORY Family History  Problem Relation Age of Onset  . Colon cancer Sister 28       deceased at age 94    SOCIAL HISTORY Social History   Tobacco Use  . Smoking status: Never Smoker  . Smokeless tobacco: Never Used  Substance Use Topics  . Alcohol use: No  . Drug use: No         OPHTHALMIC EXAM:  Base Eye Exam    Visual Acuity (Snellen - Linear)  Right Left   Dist cc 20/400 20/400   Dist ph cc 20/200 20/200       Tonometry (Tonopen, 9:35 AM)      Right Left   Pressure 14 15       Pupils      Pupils Dark Light Shape React APD   Right PERRL 4 3 Round Brisk None   Left PERRL 4 3 Round Brisk None       Visual Fields (Counting fingers)      Left Right    Full Full       Neuro/Psych    Oriented x3: Yes   Mood/Affect: Normal       Dilation    Both eyes: 1.0% Mydriacyl, 2.5% Phenylephrine @ 9:35 AM        Slit Lamp and Fundus Exam    External Exam      Right Left   External Normal Normal       Slit Lamp Exam      Right Left   Lids/Lashes Normal Normal   Conjunctiva/Sclera White and quiet White and quiet   Cornea Clear Clear   Anterior Chamber Deep and quiet  Deep and quiet   Iris Round and reactive Round and reactive   Lens 2+ Nuclear sclerosis 2+ Nuclear sclerosis   Anterior Vitreous Normal Normal       Fundus Exam      Right Left   Posterior Vitreous Posterior vitreous detachment Posterior vitreous detachment   Disc 3+ Optic disc atrophy, 3+ Pallor 3+ Optic disc atrophy, 3+ Pallor   C/D Ratio 0.75 0.75   Macula Geographic atrophy in the FAZ, Advanced age related macular degeneration Geographic atrophy in the FAZ, Advanced age related macular degeneration   Vessels Normal    Periphery Normal           IMAGING AND PROCEDURES  Imaging and Procedures for 08/27/19  OCT, Retina - OU - Both Eyes       Right Eye Quality was good. Scan locations included subfoveal. Central Foveal Thickness: 150. Progression has been stable. Findings include cystoid macular edema, central retinal atrophy, outer retinal atrophy, subretinal scarring.   Left Eye Quality was good. Scan locations included subfoveal. Central Foveal Thickness: 229. Progression has been stable. Findings include cystoid macular edema, intraretinal fluid, subretinal scarring, abnormal foveal contour.   Notes OD, much less intraretinal fluid and subretinal fluid and CME, most likely due to progression of outer retinal atrophy, choriocapillaries dropout  OS, stable macular anatomy       Intravitreal Injection, Pharmacologic Agent - OD - Right Eye       Time Out 08/27/2019. 10:17 AM. Confirmed correct patient, procedure, site, and patient consented.   Anesthesia Topical anesthesia was used. Anesthetic medications included Akten 3.5%.   Procedure Preparation included Ofloxacin , 10% betadine to eyelids, 5% betadine to ocular surface. A 30 gauge needle was used.   Injection:  1.25 mg Bevacizumab (AVASTIN) SOLN   NDC: [address]-2, Lot: [id number]   Route: Intravitreal, Site: Right Eye, Waste: 0 mg  Post-op Post injection exam found visual acuity of at least counting fingers.  The patient tolerated the procedure well. There were no complications. The patient received written and verbal post procedure care education. Post injection medications were not given.                 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  Now progression to outer retinal atrophy.  We will repeat intravitreal Avastin today and examination only OD next and OS      ICD-10-CM   1. Exudative age-related macular degeneration of right eye with active choroidal neovascularization (HCC)  H35.3211 OCT, Retina - OU - Both Eyes    Intravitreal Injection, Pharmacologic Agent - OD - Right Eye    Bevacizumab (AVASTIN) SOLN 1.25 mg  2. Primary open angle glaucoma of both eyes, severe stage  H40.1133   3. Advanced nonexudative age-related macular degeneration of right eye with subfoveal involvement  H35.3114 Intravitreal Injection, Pharmacologic Agent - OD - Right Eye    Bevacizumab (AVASTIN) SOLN 1.25 mg  4. Advanced nonexudative age-related macular degeneration of left eye with subfoveal involvement  H35.3124 Intravitreal Injection, Pharmacologic Agent - OD - Right Eye    Bevacizumab (AVASTIN) SOLN 1.25 mg    1.  2.  3.  Ophthalmic Meds Ordered this visit:  Meds ordered this encounter  Medications  . Bevacizumab (AVASTIN) SOLN 1.25 mg       Return in about 3 months (around 11/27/2019) for DILATE OU, OCT.  There are no Patient Instructions on file for this visit.   Explained the diagnoses, plan, and follow up with the patient and they expressed understanding.  Patient expressed understanding of the importance of proper follow up care.   Clent Demark Zakyia Gagan M.D. Diseases & Surgery of the Retina and Vitreous [facility] [place] 08/27/19     Abbreviations: M myopia (nearsighted); A astigmatism; H  hyperopia (farsighted); P presbyopia; Mrx spectacle prescription;  CTL contact lenses; OD right eye; OS left eye; OU both eyes  XT exotropia; ET esotropia; PEK punctate epithelial keratitis; PEE punctate epithelial erosions; DES dry eye syndrome; MGD meibomian gland dysfunction; ATs artificial tears; PFAT's preservative free artificial tears; [place] nuclear sclerotic cataract; PSC posterior subcapsular cataract; ERM epi-retinal membrane; PVD posterior vitreous detachment; RD retinal detachment; DM diabetes mellitus; DR diabetic retinopathy; NPDR non-proliferative diabetic retinopathy; PDR proliferative diabetic retinopathy; CSME clinically significant macular edema; DME diabetic macular edema; dbh dot blot hemorrhages; CWS cotton wool spot; POAG primary open angle glaucoma; C/D cup-to-disc ratio; HVF humphrey visual field; GVF goldmann visual field; OCT optical coherence tomography; IOP intraocular pressure; BRVO Branch retinal vein occlusion; CRVO central retinal vein occlusion; CRAO central retinal artery occlusion; BRAO branch retinal artery occlusion; RT retinal tear; SB scleral buckle; PPV pars plana vitrectomy; VH Vitreous hemorrhage; PRP panretinal laser photocoagulation; IVK intravitreal kenalog; VMT vitreomacular traction; MH Macular hole;  NVD neovascularization of the disc; NVE neovascularization elsewhere; AREDS age related eye disease study; ARMD age related macular degeneration; POAG primary open angle glaucoma; EBMD epithelial/anterior basement membrane dystrophy; ACIOL anterior chamber intraocular lens; IOL intraocular lens; PCIOL posterior chamber intraocular lens; Phaco/IOL phacoemulsification with intraocular lens placement; [place] photorefractive keratectomy; LASIK laser assisted in situ keratomileusis; HTN hypertension; DM diabetes mellitus; COPD chronic obstructive pulmonary disease

## 2019-08-27 NOTE — Assessment & Plan Note (Signed)
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  Now progression to outer retinal atrophy.  We will repeat intravitreal Avastin today and examination only OD next and OS

## 2019-09-25 DIAGNOSIS — K219 Gastro-esophageal reflux disease without esophagitis: Secondary | ICD-10-CM | POA: Diagnosis not present

## 2019-09-25 DIAGNOSIS — E7849 Other hyperlipidemia: Secondary | ICD-10-CM | POA: Diagnosis not present

## 2019-09-25 DIAGNOSIS — I10 Essential (primary) hypertension: Secondary | ICD-10-CM | POA: Diagnosis not present

## 2019-11-09 DIAGNOSIS — D485 Neoplasm of uncertain behavior of skin: Secondary | ICD-10-CM | POA: Diagnosis not present

## 2019-11-09 DIAGNOSIS — Z85828 Personal history of other malignant neoplasm of skin: Secondary | ICD-10-CM | POA: Diagnosis not present

## 2019-11-09 DIAGNOSIS — L570 Actinic keratosis: Secondary | ICD-10-CM | POA: Diagnosis not present

## 2019-11-09 DIAGNOSIS — L821 Other seborrheic keratosis: Secondary | ICD-10-CM | POA: Diagnosis not present

## 2019-11-26 DIAGNOSIS — K219 Gastro-esophageal reflux disease without esophagitis: Secondary | ICD-10-CM | POA: Diagnosis not present

## 2019-11-26 DIAGNOSIS — E7849 Other hyperlipidemia: Secondary | ICD-10-CM | POA: Diagnosis not present

## 2019-11-26 DIAGNOSIS — I10 Essential (primary) hypertension: Secondary | ICD-10-CM | POA: Diagnosis not present

## 2019-12-03 ENCOUNTER — Encounter (INDEPENDENT_AMBULATORY_CARE_PROVIDER_SITE_OTHER): Payer: Medicare HMO | Admitting: Ophthalmology

## 2019-12-03 ENCOUNTER — Other Ambulatory Visit: Payer: Self-pay

## 2019-12-03 ENCOUNTER — Ambulatory Visit (INDEPENDENT_AMBULATORY_CARE_PROVIDER_SITE_OTHER): Payer: Medicare HMO | Admitting: Ophthalmology

## 2019-12-03 ENCOUNTER — Encounter (INDEPENDENT_AMBULATORY_CARE_PROVIDER_SITE_OTHER): Payer: Self-pay | Admitting: Ophthalmology

## 2019-12-03 DIAGNOSIS — H353114 Nonexudative age-related macular degeneration, right eye, advanced atrophic with subfoveal involvement: Secondary | ICD-10-CM | POA: Diagnosis not present

## 2019-12-03 DIAGNOSIS — H353124 Nonexudative age-related macular degeneration, left eye, advanced atrophic with subfoveal involvement: Secondary | ICD-10-CM | POA: Diagnosis not present

## 2019-12-03 DIAGNOSIS — H2513 Age-related nuclear cataract, bilateral: Secondary | ICD-10-CM

## 2019-12-03 DIAGNOSIS — H353211 Exudative age-related macular degeneration, right eye, with active choroidal neovascularization: Secondary | ICD-10-CM

## 2019-12-03 DIAGNOSIS — H353212 Exudative age-related macular degeneration, right eye, with inactive choroidal neovascularization: Secondary | ICD-10-CM | POA: Diagnosis not present

## 2019-12-03 HISTORY — DX: Age-related nuclear cataract, bilateral: H25.13

## 2019-12-03 NOTE — Assessment & Plan Note (Signed)
Stable OD, with no active CNVM observe

## 2019-12-03 NOTE — Assessment & Plan Note (Signed)
Stable overall, will observe

## 2019-12-03 NOTE — Progress Notes (Signed)
12/03/2019     CHIEF COMPLAINT Patient presents for Retina Follow Up   HISTORY OF PRESENT ILLNESS: Sabrina Cantrell is a 79 y.o. female who presents to the clinic today for:   HPI    Retina Follow Up    Patient presents with  Wet AMD.  In right eye.  Severity is moderate.  Duration of 3 months.  Since onset it is stable.  I, the attending physician,  performed the HPI with the patient and updated documentation appropriately.          Comments    3 Month f\u OU. OCT 3 Month s\p Avastin inj OD   Pt states vision has been stable. Denies floaters and FOL.       Last edited by Hurman Horn, MD on 12/03/2019  2:45 PM. (History)      Referring physician: Caryl Bis, MD [place],  [place] [id number]  HISTORICAL INFORMATION:   Selected notes from the MEDICAL RECORD NUMBER       CURRENT MEDICATIONS: Current Outpatient Medications (Ophthalmic Drugs)  Medication Sig  . dorzolamide-timolol (COSOPT) 22.3-6.8 MG/ML ophthalmic solution Place 1 drop into both eyes 2 (two) times daily.  Marland Kitchen latanoprost (XALATAN) 0.005 % ophthalmic solution Place 1 drop into both eyes at bedtime.   No current facility-administered medications for this visit. (Ophthalmic Drugs)   Current Outpatient Medications (Other)  Medication Sig  . acetaminophen (TYLENOL) 500 MG tablet Take 500 mg by mouth every 6 (six) hours as needed for moderate pain.  Marland Kitchen alendronate (FOSAMAX) 70 MG tablet Take 70 mg by mouth every Monday.   Marland Kitchen amLODipine (NORVASC) 5 MG tablet Take 5 mg by mouth daily.   . Calcium Carb-Cholecalciferol (CALCIUM 600 + D PO) Take 1 tablet by mouth daily.  Marland Kitchen FIBER PO Take 2 capsules by mouth daily.  Marland Kitchen losartan-hydrochlorothiazide (HYZAAR) 50-12.5 MG tablet Take 1 tablet by mouth daily.  . Melatonin 5 MG CAPS Take 5 mg by mouth at bedtime.  . Multiple Vitamins-Minerals (PRESERVISION AREDS 2) CAPS Take 1 capsule by mouth 2 (two) times daily.  . polyethylene glycol-electrolytes (TRILYTE) 420  g solution Take 4,000 mLs by mouth as directed.  . simvastatin (ZOCOR) 40 MG tablet Take 40 mg by mouth every evening.   No current facility-administered medications for this visit. (Other)      REVIEW OF SYSTEMS:    ALLERGIES No Known Allergies  PAST MEDICAL HISTORY Past Medical History:  Diagnosis Date  . HTN (hypertension)   . Hypercholesterolemia   . Macular degeneration   . Osteopenia    Past Surgical History:  Procedure Laterality Date  . COLONOSCOPY N/A 01/13/2013   External hemorrhoids. 5 mm polyp (tubular adenoma) 10 cm from anal verge. Colonic diverticulosis.   . COLONOSCOPY N/A 06/05/2019   Procedure: COLONOSCOPY;  Surgeon: Daneil Dolin, MD;  [facility];  Service: Endoscopy;  Laterality: N/A;  12:00pm    FAMILY HISTORY Family History  Problem Relation Age of Onset  . Colon cancer Sister 1       deceased at age 28    SOCIAL HISTORY Social History   Tobacco Use  . Smoking status: Never Smoker  . Smokeless tobacco: Never Used  Substance Use Topics  . Alcohol use: No  . Drug use: No         OPHTHALMIC EXAM:  Base Eye Exam    Visual Acuity (Snellen - Linear)      Right Left  Dist cc 20/400 20/400   Dist ph cc 20/200 20/200   Correction: Glasses       Tonometry (Tonopen, 2:21 PM)      Right Left   Pressure 20 18       Pupils      Pupils Dark Light Shape React APD   Right PERRL 4 3 Round Brisk None   Left PERRL 4 3 Round Brisk None       Visual Fields (Counting fingers)      Left Right    Full Full       Neuro/Psych    Oriented x3: Yes   Mood/Affect: Normal       Dilation    Both eyes: 1.0% Mydriacyl, 2.5% Phenylephrine @ 2:21 PM        Slit Lamp and Fundus Exam    External Exam      Right Left   External Normal Normal       Slit Lamp Exam      Right Left   Lids/Lashes Normal Normal   Conjunctiva/Sclera White and quiet White and quiet   Cornea Clear Clear   Anterior Chamber Deep and quiet Deep and  quiet   Iris Round and reactive Round and reactive   Lens 2+ Nuclear sclerosis 2+ Nuclear sclerosis   Anterior Vitreous Normal Normal       Fundus Exam      Right Left   Posterior Vitreous Posterior vitreous detachment Posterior vitreous detachment   Disc 3+ Optic disc atrophy, 3+ Pallor 3+ Optic disc atrophy, 3+ Pallor   C/D Ratio 0.75 0.75   Macula Geographic atrophy in the FAZ, Advanced age related macular degeneration Geographic atrophy in the FAZ, Advanced age related macular degeneration   Vessels Normal    Periphery Normal           IMAGING AND PROCEDURES  Imaging and Procedures for 12/03/19  OCT, Retina - OU - Both Eyes       Right Eye Quality was good. Scan locations included subfoveal. Central Foveal Thickness: 201. Progression has been stable. Findings include no IRF, abnormal foveal contour, no SRF, outer retinal atrophy, central retinal atrophy, inner retinal atrophy.   Left Eye Quality was good. Scan locations included subfoveal. Central Foveal Thickness: 229. Progression has been stable. Findings include no SRF, abnormal foveal contour, no IRF, outer retinal atrophy, central retinal atrophy, inner retinal atrophy.   Notes No active CNVM OU, geographic atrophy of the RPE and retinal centrally                ASSESSMENT/PLAN:  Advanced nonexudative age-related macular degeneration of left eye with subfoveal involvement Stable overall, will observe  Advanced nonexudative age-related macular degeneration of right eye with subfoveal involvement Stable OD, with no active CNVM observe  Nuclear sclerotic cataract of both eyes Follow-up and monitored with Dr. Nathanial Rancher of [facility]      ICD-10-CM   1. Exudative age-related macular degeneration of right eye with active choroidal neovascularization (HCC)  H35.3211 OCT, Retina - OU - Both Eyes  2. Advanced nonexudative age-related macular degeneration of left eye with subfoveal involvement  H35.3124    3. Advanced nonexudative age-related macular degeneration of right eye with subfoveal involvement  H35.3114   4. 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 Nuclear sclerotic cataract of both eyes  H25.13     1.  Active maculopathy OU, will observe  2.  3.  Ophthalmic Meds Ordered this visit:  No orders of the defined types were placed in this encounter.      Return in about 6 months (around 06/02/2020) for DILATE OU, OCT.  There are no Patient Instructions on file for this visit.   Explained the diagnoses, plan, and follow up with the patient and they expressed understanding.  Patient expressed understanding of the importance of proper follow up care.   Clent Demark Deshanda Molitor M.D. Diseases & Surgery of the Retina and Vitreous Retina & Diabetic [place] 12/03/19     Abbreviations: M myopia (nearsighted); A astigmatism; H hyperopia (farsighted); P presbyopia; Mrx spectacle prescription;  CTL contact lenses; OD right eye; OS left eye; OU both eyes  XT exotropia; ET esotropia; PEK punctate epithelial keratitis; PEE punctate epithelial erosions; DES dry eye syndrome; MGD meibomian gland dysfunction; ATs artificial tears; PFAT's preservative free artificial tears; [place] nuclear sclerotic cataract; PSC posterior subcapsular cataract; ERM epi-retinal membrane; PVD posterior vitreous detachment; RD retinal detachment; DM diabetes mellitus; DR diabetic retinopathy; NPDR non-proliferative diabetic retinopathy; PDR proliferative diabetic retinopathy; CSME clinically significant macular edema; DME diabetic macular edema; dbh dot blot hemorrhages; CWS cotton wool spot; POAG primary open angle glaucoma; C/D cup-to-disc ratio; HVF humphrey visual field; GVF goldmann visual field; OCT optical coherence tomography; IOP intraocular pressure; BRVO Branch retinal vein occlusion; CRVO central retinal vein occlusion; CRAO central retinal  artery occlusion; BRAO branch retinal artery occlusion; RT retinal tear; SB scleral buckle; PPV pars plana vitrectomy; VH Vitreous hemorrhage; PRP panretinal laser photocoagulation; IVK intravitreal kenalog; VMT vitreomacular traction; MH Macular hole;  NVD neovascularization of the disc; NVE neovascularization elsewhere; AREDS age related eye disease study; ARMD age related macular degeneration; POAG primary open angle glaucoma; EBMD epithelial/anterior basement membrane dystrophy; ACIOL anterior chamber intraocular lens; IOL intraocular lens; PCIOL posterior chamber intraocular lens; Phaco/IOL phacoemulsification with intraocular lens placement; [place] photorefractive keratectomy; LASIK laser assisted in situ keratomileusis; HTN hypertension; DM diabetes mellitus; COPD chronic obstructive pulmonary disease

## 2019-12-03 NOTE — Assessment & Plan Note (Signed)
Follow-up and monitored with Dr. Nathanial Rancher of [facility]

## 2019-12-15 DIAGNOSIS — Z1329 Encounter for screening for other suspected endocrine disorder: Secondary | ICD-10-CM | POA: Diagnosis not present

## 2019-12-15 DIAGNOSIS — E782 Mixed hyperlipidemia: Secondary | ICD-10-CM | POA: Diagnosis not present

## 2019-12-15 DIAGNOSIS — R5383 Other fatigue: Secondary | ICD-10-CM | POA: Diagnosis not present

## 2019-12-15 DIAGNOSIS — K219 Gastro-esophageal reflux disease without esophagitis: Secondary | ICD-10-CM | POA: Diagnosis not present

## 2019-12-15 DIAGNOSIS — Z0001 Encounter for general adult medical examination with abnormal findings: Secondary | ICD-10-CM | POA: Diagnosis not present

## 2019-12-15 DIAGNOSIS — R7301 Impaired fasting glucose: Secondary | ICD-10-CM | POA: Diagnosis not present

## 2019-12-15 DIAGNOSIS — I10 Essential (primary) hypertension: Secondary | ICD-10-CM | POA: Diagnosis not present

## 2019-12-17 DIAGNOSIS — K2100 Gastro-esophageal reflux disease with esophagitis, without bleeding: Secondary | ICD-10-CM | POA: Diagnosis not present

## 2019-12-17 DIAGNOSIS — E782 Mixed hyperlipidemia: Secondary | ICD-10-CM | POA: Diagnosis not present

## 2019-12-17 DIAGNOSIS — E8881 Metabolic syndrome: Secondary | ICD-10-CM | POA: Diagnosis not present

## 2019-12-17 DIAGNOSIS — I10 Essential (primary) hypertension: Secondary | ICD-10-CM | POA: Diagnosis not present

## 2019-12-17 DIAGNOSIS — Z23 Encounter for immunization: Secondary | ICD-10-CM | POA: Diagnosis not present

## 2019-12-17 DIAGNOSIS — Z0001 Encounter for general adult medical examination with abnormal findings: Secondary | ICD-10-CM | POA: Diagnosis not present

## 2019-12-17 DIAGNOSIS — R7301 Impaired fasting glucose: Secondary | ICD-10-CM | POA: Diagnosis not present

## 2019-12-17 DIAGNOSIS — Z6832 Body mass index (BMI) 32.0-32.9, adult: Secondary | ICD-10-CM | POA: Diagnosis not present

## 2020-01-15 DIAGNOSIS — Z23 Encounter for immunization: Secondary | ICD-10-CM | POA: Diagnosis not present

## 2020-01-26 DIAGNOSIS — I10 Essential (primary) hypertension: Secondary | ICD-10-CM | POA: Diagnosis not present

## 2020-01-26 DIAGNOSIS — E7849 Other hyperlipidemia: Secondary | ICD-10-CM | POA: Diagnosis not present

## 2020-01-26 DIAGNOSIS — K219 Gastro-esophageal reflux disease without esophagitis: Secondary | ICD-10-CM | POA: Diagnosis not present

## 2020-01-27 DIAGNOSIS — R928 Other abnormal and inconclusive findings on diagnostic imaging of breast: Secondary | ICD-10-CM | POA: Diagnosis not present

## 2020-01-27 DIAGNOSIS — R921 Mammographic calcification found on diagnostic imaging of breast: Secondary | ICD-10-CM | POA: Diagnosis not present

## 2020-02-26 DIAGNOSIS — E7849 Other hyperlipidemia: Secondary | ICD-10-CM | POA: Diagnosis not present

## 2020-02-26 DIAGNOSIS — K219 Gastro-esophageal reflux disease without esophagitis: Secondary | ICD-10-CM | POA: Diagnosis not present

## 2020-02-26 DIAGNOSIS — R7301 Impaired fasting glucose: Secondary | ICD-10-CM | POA: Diagnosis not present

## 2020-02-26 DIAGNOSIS — I10 Essential (primary) hypertension: Secondary | ICD-10-CM | POA: Diagnosis not present

## 2020-03-28 DIAGNOSIS — H401132 Primary open-angle glaucoma, bilateral, moderate stage: Secondary | ICD-10-CM | POA: Diagnosis not present

## 2020-03-28 DIAGNOSIS — H353124 Nonexudative age-related macular degeneration, left eye, advanced atrophic with subfoveal involvement: Secondary | ICD-10-CM | POA: Diagnosis not present

## 2020-03-28 DIAGNOSIS — H2513 Age-related nuclear cataract, bilateral: Secondary | ICD-10-CM | POA: Diagnosis not present

## 2020-03-28 DIAGNOSIS — H353211 Exudative age-related macular degeneration, right eye, with active choroidal neovascularization: Secondary | ICD-10-CM | POA: Diagnosis not present

## 2020-04-11 DIAGNOSIS — G4733 Obstructive sleep apnea (adult) (pediatric): Secondary | ICD-10-CM | POA: Diagnosis not present

## 2020-04-25 DIAGNOSIS — R7301 Impaired fasting glucose: Secondary | ICD-10-CM | POA: Diagnosis not present

## 2020-04-25 DIAGNOSIS — E7849 Other hyperlipidemia: Secondary | ICD-10-CM | POA: Diagnosis not present

## 2020-04-25 DIAGNOSIS — K219 Gastro-esophageal reflux disease without esophagitis: Secondary | ICD-10-CM | POA: Diagnosis not present

## 2020-04-25 DIAGNOSIS — I10 Essential (primary) hypertension: Secondary | ICD-10-CM | POA: Diagnosis not present

## 2020-05-10 DIAGNOSIS — H2513 Age-related nuclear cataract, bilateral: Secondary | ICD-10-CM | POA: Diagnosis not present

## 2020-05-10 DIAGNOSIS — H353124 Nonexudative age-related macular degeneration, left eye, advanced atrophic with subfoveal involvement: Secondary | ICD-10-CM | POA: Diagnosis not present

## 2020-05-10 DIAGNOSIS — H401132 Primary open-angle glaucoma, bilateral, moderate stage: Secondary | ICD-10-CM | POA: Diagnosis not present

## 2020-05-10 DIAGNOSIS — H353211 Exudative age-related macular degeneration, right eye, with active choroidal neovascularization: Secondary | ICD-10-CM | POA: Diagnosis not present

## 2020-05-25 DIAGNOSIS — I10 Essential (primary) hypertension: Secondary | ICD-10-CM | POA: Diagnosis not present

## 2020-05-25 DIAGNOSIS — R7301 Impaired fasting glucose: Secondary | ICD-10-CM | POA: Diagnosis not present

## 2020-05-25 DIAGNOSIS — K219 Gastro-esophageal reflux disease without esophagitis: Secondary | ICD-10-CM | POA: Diagnosis not present

## 2020-05-25 DIAGNOSIS — E7849 Other hyperlipidemia: Secondary | ICD-10-CM | POA: Diagnosis not present

## 2020-06-02 ENCOUNTER — Ambulatory Visit (INDEPENDENT_AMBULATORY_CARE_PROVIDER_SITE_OTHER): Payer: Medicare HMO | Admitting: Ophthalmology

## 2020-06-02 ENCOUNTER — Encounter (INDEPENDENT_AMBULATORY_CARE_PROVIDER_SITE_OTHER): Payer: Self-pay | Admitting: Ophthalmology

## 2020-06-02 ENCOUNTER — Other Ambulatory Visit: Payer: Self-pay

## 2020-06-02 DIAGNOSIS — H353124 Nonexudative age-related macular degeneration, left eye, advanced atrophic with subfoveal involvement: Secondary | ICD-10-CM

## 2020-06-02 DIAGNOSIS — H2513 Age-related nuclear cataract, bilateral: Secondary | ICD-10-CM | POA: Diagnosis not present

## 2020-06-02 DIAGNOSIS — H401133 Primary open-angle glaucoma, bilateral, severe stage: Secondary | ICD-10-CM

## 2020-06-02 DIAGNOSIS — H353114 Nonexudative age-related macular degeneration, right eye, advanced atrophic with subfoveal involvement: Secondary | ICD-10-CM | POA: Diagnosis not present

## 2020-06-02 DIAGNOSIS — H353134 Nonexudative age-related macular degeneration, bilateral, advanced atrophic with subfoveal involvement: Secondary | ICD-10-CM

## 2020-06-02 DIAGNOSIS — H353211 Exudative age-related macular degeneration, right eye, with active choroidal neovascularization: Secondary | ICD-10-CM | POA: Diagnosis not present

## 2020-06-02 NOTE — Assessment & Plan Note (Signed)
Follow-up as per Dr. Duanne Guess

## 2020-06-02 NOTE — Assessment & Plan Note (Signed)
I agree with Dr. Duanne Guess, proceeding with catheter extraction with intraocular lens placement in each eye will maximize current visual abilities

## 2020-06-02 NOTE — Progress Notes (Signed)
06/02/2020     CHIEF COMPLAINT Patient presents for Retina Follow Up (6 Mo F/U OU//Pt denies noticeable changes to [place] OU since last visit. Pt denies ocular pain, flashes of light, or floaters OU. //)   HISTORY OF PRESENT ILLNESS: Sabrina Cantrell is a 80 y.o. female who presents to the clinic today for:   HPI    Retina Follow Up    Patient presents with  Wet AMD.  In right eye.  This started 6 months ago.  Severity is mild.  Duration of 6 months.  Since onset it is stable. Additional comments: 6 Mo F/U OU  Pt denies noticeable changes to [place] OU since last visit. Pt denies ocular pain, flashes of light, or floaters OU.          Last edited by Rockie Neighbours, [place] on 06/02/2020  1:07 PM. (History)      Referring physician: Caryl Bis, MD [place],   [id number]  HISTORICAL INFORMATION:   Selected notes from the MEDICAL RECORD NUMBER       CURRENT MEDICATIONS: Current Outpatient Medications (Ophthalmic Drugs)  Medication Sig  . dorzolamide-timolol (COSOPT) 22.3-6.8 MG/ML ophthalmic solution Place 1 drop into both eyes 2 (two) times daily.  Marland Kitchen latanoprost (XALATAN) 0.005 % ophthalmic solution Place 1 drop into both eyes at bedtime.   No current facility-administered medications for this visit. (Ophthalmic Drugs)   Current Outpatient Medications (Other)  Medication Sig  . acetaminophen (TYLENOL) 500 MG tablet Take 500 mg by mouth every 6 (six) hours as needed for moderate pain.  Marland Kitchen alendronate (FOSAMAX) 70 MG tablet Take 70 mg by mouth every Monday.   Marland Kitchen amLODipine (NORVASC) 5 MG tablet Take 5 mg by mouth daily.   . Calcium Carb-Cholecalciferol (CALCIUM 600 + D PO) Take 1 tablet by mouth daily.  Marland Kitchen FIBER PO Take 2 capsules by mouth daily.  Marland Kitchen losartan-hydrochlorothiazide (HYZAAR) 50-12.5 MG tablet Take 1 tablet by mouth daily.  . Melatonin 5 MG CAPS Take 5 mg by mouth at bedtime.  . Multiple Vitamins-Minerals (PRESERVISION AREDS 2) CAPS Take 1 capsule by mouth 2  (two) times daily.  . polyethylene glycol-electrolytes (TRILYTE) 420 g solution Take 4,000 mLs by mouth as directed.  . simvastatin (ZOCOR) 40 MG tablet Take 40 mg by mouth every evening.   No current facility-administered medications for this visit. (Other)      REVIEW OF SYSTEMS:    ALLERGIES No Known Allergies  PAST MEDICAL HISTORY Past Medical History:  Diagnosis Date  . HTN (hypertension)   . Hypercholesterolemia   . Macular degeneration   . Osteopenia    Past Surgical History:  Procedure Laterality Date  . COLONOSCOPY N/A 01/13/2013   External hemorrhoids. 5 mm polyp (tubular adenoma) 10 cm from anal verge. Colonic diverticulosis.   . COLONOSCOPY N/A 06/05/2019   Procedure: COLONOSCOPY;  Surgeon: Daneil Dolin, MD;  [facility];  Service: Endoscopy;  Laterality: N/A;  12:00pm    FAMILY HISTORY Family History  Problem Relation Age of Onset  . Colon cancer Sister 63       deceased at age 59    SOCIAL HISTORY Social History   Tobacco Use  . Smoking status: Never Smoker  . Smokeless tobacco: Never Used  Substance Use Topics  . Alcohol use: No  . Drug use: No         OPHTHALMIC EXAM: Base Eye Exam    Visual Acuity (ETDRS)  Right Left   Dist cc 20/400 20/400   Dist ph cc NI 20/200   Correction: Glasses       Tonometry (Tonopen, 1:07 PM)      Right Left   Pressure 15 14       Pupils      Pupils Dark Light Shape React APD   Right PERRL 5 4 Round Brisk None   Left PERRL 5 4 Round Brisk None       Visual Fields (Counting fingers)      Left Right    Full Full       Extraocular Movement      Right Left    Full Full       Neuro/Psych    Oriented x3: Yes   Mood/Affect: Normal       Dilation    Both eyes: 1.0% Mydriacyl, 2.5% Phenylephrine @ 1:11 PM        Slit Lamp and Fundus Exam    External Exam      Right Left   External Normal Normal       Slit Lamp Exam      Right Left   Lids/Lashes Normal Normal    Conjunctiva/Sclera White and quiet White and quiet   Cornea Clear Clear   Anterior Chamber Deep and quiet Deep and quiet   Iris Round and reactive Round and reactive   Lens 2.5+ Nuclear sclerosis 2.5+ Nuclear sclerosis   Anterior Vitreous Normal Normal       Fundus Exam      Right Left   Posterior Vitreous Posterior vitreous detachment Posterior vitreous detachment   Disc 3+ Optic disc atrophy, 3+ Pallor 3+ Optic disc atrophy, 3+ Pallor   C/D Ratio 0.75 0.75   Macula Geographic atrophy in the FAZ, Advanced age related macular degeneration Geographic atrophy in the FAZ, Advanced age related macular degeneration   Vessels Normal    Periphery Normal           IMAGING AND PROCEDURES  Imaging and Procedures for 06/02/20  OCT, Retina - OU - Both Eyes       Right Eye Quality was good. Scan locations included subfoveal. Central Foveal Thickness: 201. Progression has been stable. Findings include no IRF, abnormal foveal contour, no SRF, outer retinal atrophy, central retinal atrophy, inner retinal atrophy.   Left Eye Quality was good. Scan locations included subfoveal. Central Foveal Thickness: 229. Progression has been stable. Findings include no SRF, abnormal foveal contour, no IRF, outer retinal atrophy, central retinal atrophy, inner retinal atrophy.   Notes No active CNVM OU, geographic atrophy of the RPE and retinal centrally.  Medial opacity                ASSESSMENT/PLAN:  Nuclear sclerotic cataract of both eyes I agree with Dr. Duanne Guess, proceeding with catheter extraction with intraocular lens placement in each eye will maximize current visual abilities  Advanced nonexudative age-related macular degeneration of right eye with subfoveal involvement Currently accounts for vision yet view is somewhat hampered by cataract opacity.  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  Medial opacity present  Primary open angle glaucoma of both eyes, severe stage Follow-up as per Dr. Duanne Guess      ICD-10-CM   1. Exudative age-related macular degeneration of right eye with active choroidal neovascularization ([place])  H35.3211 OCT, Retina -  OU - Both Eyes  2. Nuclear sclerotic cataract of both eyes  H25.13   3. Advanced nonexudative age-related macular degeneration of right eye with subfoveal involvement  H35.3114   4. Advanced nonexudative age-related macular degeneration of left eye with subfoveal involvement  H35.3124   5. Primary open angle glaucoma of both eyes, severe stage  H40.1133     1.  No active macular disease at this time.  Acuity accounted for by progressive RPE atrophy in the macula OU.  2.  Cataract OU, cataract surgery would maximize visual potential proceed as scheduled in the coming weeks  3.  Ophthalmic Meds Ordered this visit:  No orders of the defined types were placed in this encounter.      Return in about 6 months (around 12/02/2020) for DILATE OU, OCT.  There are no Patient Instructions on file for this visit.   Explained the diagnoses, plan, and follow up with the patient and they expressed understanding.  Patient expressed understanding of the importance of proper follow up care.   Clent Demark Donn Zanetti M.D. Diseases & Surgery of the Retina and Vitreous Retina & Diabetic [place] 06/02/20     Abbreviations: M myopia (nearsighted); A astigmatism; H hyperopia (farsighted); P presbyopia; Mrx spectacle prescription;  CTL contact lenses; OD right eye; OS left eye; OU both eyes  XT exotropia; ET esotropia; PEK punctate epithelial keratitis; PEE punctate epithelial erosions; DES dry eye syndrome; MGD meibomian gland dysfunction; ATs artificial tears; PFAT's preservative free artificial tears; [place] nuclear sclerotic cataract; PSC posterior subcapsular  cataract; ERM epi-retinal membrane; PVD posterior vitreous detachment; RD retinal detachment; DM diabetes mellitus; DR diabetic retinopathy; NPDR non-proliferative diabetic retinopathy; PDR proliferative diabetic retinopathy; CSME clinically significant macular edema; DME diabetic macular edema; dbh dot blot hemorrhages; CWS cotton wool spot; POAG primary open angle glaucoma; C/D cup-to-disc ratio; HVF humphrey visual field; GVF goldmann visual field; OCT optical coherence tomography; IOP intraocular pressure; BRVO Branch retinal vein occlusion; CRVO central retinal vein occlusion; CRAO central retinal artery occlusion; BRAO branch retinal artery occlusion; RT retinal tear; SB scleral buckle; PPV pars plana vitrectomy; VH Vitreous hemorrhage; PRP panretinal laser photocoagulation; IVK intravitreal kenalog; VMT vitreomacular traction; MH Macular hole;  NVD neovascularization of the disc; NVE neovascularization elsewhere; AREDS age related eye disease study; ARMD age related macular degeneration; POAG primary open angle glaucoma; EBMD epithelial/anterior basement membrane dystrophy; ACIOL anterior chamber intraocular lens; IOL intraocular lens; PCIOL posterior chamber intraocular lens; Phaco/IOL phacoemulsification with intraocular lens placement; [place] photorefractive keratectomy; LASIK laser assisted in situ keratomileusis; HTN hypertension; DM diabetes mellitus; COPD chronic obstructive pulmonary disease

## 2020-06-02 NOTE — Assessment & Plan Note (Signed)
Similar findings OS with atrophy accounting for vision no active CNVM.  Medial opacity present

## 2020-06-02 NOTE — Assessment & Plan Note (Signed)
Currently accounts for vision yet view is somewhat hampered by cataract opacity.  No complications noted no treatment indicated

## 2020-06-08 DIAGNOSIS — E782 Mixed hyperlipidemia: Secondary | ICD-10-CM | POA: Diagnosis not present

## 2020-06-08 DIAGNOSIS — R946 Abnormal results of thyroid function studies: Secondary | ICD-10-CM | POA: Diagnosis not present

## 2020-06-08 DIAGNOSIS — K219 Gastro-esophageal reflux disease without esophagitis: Secondary | ICD-10-CM | POA: Diagnosis not present

## 2020-06-08 DIAGNOSIS — E7849 Other hyperlipidemia: Secondary | ICD-10-CM | POA: Diagnosis not present

## 2020-06-08 DIAGNOSIS — I10 Essential (primary) hypertension: Secondary | ICD-10-CM | POA: Diagnosis not present

## 2020-06-14 DIAGNOSIS — R7301 Impaired fasting glucose: Secondary | ICD-10-CM | POA: Diagnosis not present

## 2020-06-14 DIAGNOSIS — E7849 Other hyperlipidemia: Secondary | ICD-10-CM | POA: Diagnosis not present

## 2020-06-14 DIAGNOSIS — E8881 Metabolic syndrome: Secondary | ICD-10-CM | POA: Diagnosis not present

## 2020-06-14 DIAGNOSIS — K2100 Gastro-esophageal reflux disease with esophagitis, without bleeding: Secondary | ICD-10-CM | POA: Diagnosis not present

## 2020-06-14 DIAGNOSIS — I10 Essential (primary) hypertension: Secondary | ICD-10-CM | POA: Diagnosis not present

## 2020-06-14 DIAGNOSIS — H269 Unspecified cataract: Secondary | ICD-10-CM | POA: Diagnosis not present

## 2020-06-14 DIAGNOSIS — H409 Unspecified glaucoma: Secondary | ICD-10-CM | POA: Diagnosis not present

## 2020-06-14 DIAGNOSIS — Z7189 Other specified counseling: Secondary | ICD-10-CM | POA: Diagnosis not present

## 2020-06-16 DIAGNOSIS — H401122 Primary open-angle glaucoma, left eye, moderate stage: Secondary | ICD-10-CM | POA: Diagnosis not present

## 2020-06-16 DIAGNOSIS — H2512 Age-related nuclear cataract, left eye: Secondary | ICD-10-CM | POA: Diagnosis not present

## 2020-06-25 DIAGNOSIS — R7301 Impaired fasting glucose: Secondary | ICD-10-CM | POA: Diagnosis not present

## 2020-06-25 DIAGNOSIS — I10 Essential (primary) hypertension: Secondary | ICD-10-CM | POA: Diagnosis not present

## 2020-06-25 DIAGNOSIS — K219 Gastro-esophageal reflux disease without esophagitis: Secondary | ICD-10-CM | POA: Diagnosis not present

## 2020-06-25 DIAGNOSIS — E7849 Other hyperlipidemia: Secondary | ICD-10-CM | POA: Diagnosis not present

## 2020-06-29 DIAGNOSIS — Z23 Encounter for immunization: Secondary | ICD-10-CM | POA: Diagnosis not present

## 2020-07-25 DIAGNOSIS — R7301 Impaired fasting glucose: Secondary | ICD-10-CM | POA: Diagnosis not present

## 2020-07-25 DIAGNOSIS — K219 Gastro-esophageal reflux disease without esophagitis: Secondary | ICD-10-CM | POA: Diagnosis not present

## 2020-07-25 DIAGNOSIS — E7849 Other hyperlipidemia: Secondary | ICD-10-CM | POA: Diagnosis not present

## 2020-07-25 DIAGNOSIS — I10 Essential (primary) hypertension: Secondary | ICD-10-CM | POA: Diagnosis not present

## 2020-08-11 DIAGNOSIS — Z1231 Encounter for screening mammogram for malignant neoplasm of breast: Secondary | ICD-10-CM | POA: Diagnosis not present

## 2020-08-25 DIAGNOSIS — E119 Type 2 diabetes mellitus without complications: Secondary | ICD-10-CM | POA: Diagnosis not present

## 2020-08-25 DIAGNOSIS — Z7984 Long term (current) use of oral hypoglycemic drugs: Secondary | ICD-10-CM | POA: Diagnosis not present

## 2020-08-25 DIAGNOSIS — G4733 Obstructive sleep apnea (adult) (pediatric): Secondary | ICD-10-CM | POA: Diagnosis not present

## 2020-08-25 DIAGNOSIS — E7849 Other hyperlipidemia: Secondary | ICD-10-CM | POA: Diagnosis not present

## 2020-08-25 DIAGNOSIS — I10 Essential (primary) hypertension: Secondary | ICD-10-CM | POA: Diagnosis not present

## 2020-09-04 DIAGNOSIS — H2511 Age-related nuclear cataract, right eye: Secondary | ICD-10-CM | POA: Diagnosis not present

## 2020-09-08 DIAGNOSIS — H401112 Primary open-angle glaucoma, right eye, moderate stage: Secondary | ICD-10-CM | POA: Diagnosis not present

## 2020-09-08 DIAGNOSIS — H2511 Age-related nuclear cataract, right eye: Secondary | ICD-10-CM | POA: Diagnosis not present

## 2020-09-24 DIAGNOSIS — Z20828 Contact with and (suspected) exposure to other viral communicable diseases: Secondary | ICD-10-CM | POA: Diagnosis not present

## 2020-09-25 DIAGNOSIS — E7849 Other hyperlipidemia: Secondary | ICD-10-CM | POA: Diagnosis not present

## 2020-09-25 DIAGNOSIS — R7301 Impaired fasting glucose: Secondary | ICD-10-CM | POA: Diagnosis not present

## 2020-09-25 DIAGNOSIS — I10 Essential (primary) hypertension: Secondary | ICD-10-CM | POA: Diagnosis not present

## 2020-09-25 DIAGNOSIS — K219 Gastro-esophageal reflux disease without esophagitis: Secondary | ICD-10-CM | POA: Diagnosis not present

## 2020-09-26 DIAGNOSIS — R059 Cough, unspecified: Secondary | ICD-10-CM | POA: Diagnosis not present

## 2020-09-26 DIAGNOSIS — Z20828 Contact with and (suspected) exposure to other viral communicable diseases: Secondary | ICD-10-CM | POA: Diagnosis not present

## 2020-10-13 ENCOUNTER — Other Ambulatory Visit: Payer: Self-pay

## 2020-10-13 ENCOUNTER — Encounter: Payer: Self-pay | Admitting: Pulmonary Disease

## 2020-10-13 ENCOUNTER — Telehealth: Payer: Self-pay | Admitting: Pulmonary Disease

## 2020-10-13 ENCOUNTER — Ambulatory Visit: Payer: Medicare HMO | Admitting: Pulmonary Disease

## 2020-10-13 DIAGNOSIS — G4733 Obstructive sleep apnea (adult) (pediatric): Secondary | ICD-10-CM | POA: Diagnosis not present

## 2020-10-13 DIAGNOSIS — Z9989 Dependence on other enabling machines and devices: Secondary | ICD-10-CM | POA: Diagnosis not present

## 2020-10-13 NOTE — Telephone Encounter (Signed)
Please let her know that I have reviewed her sleep studies from [facility] -This did show severe OSA with AHI [id number] So she certainly does need to continue on CPAP therapy.  Small nasal mask was used during the study. I would suggest that she proceed with mask desensitization visit at our sleep center in [facility]

## 2020-10-13 NOTE — Progress Notes (Addendum)
Subjective:    Patient ID: Sabrina Cantrell, female    DOB: 1940/03/05, 80 y.o.   MRN: [id number]  HPI   Chief Complaint  Patient presents with   Consult    Patient has CPAP and wears CPAP already. States she is here to establish care. Patient states that she is very restless at night and has had a hard time finding the right mask for her and there for is not sleeping good at night.     80 year old hypertensive presents to establish care for obstructive sleep apnea. She underwent sleep studies at [facility] 3 years ago and was told that she had sleep apnea and was placed on auto CPAP machine 4 to 20 cm she has struggled with different types of masks since then, initially started off with a nasal mask but this was smothering her changed to a fullface and alternates with nasal pillows.  DME is adapt she feels very restless and feels that she is not comfortable and removes her CPAP after a few hours of use. She was told by her son who has sleep apnea to seek appointment with a sleep specialist.  I reviewed CPAP download which shows auto CPAP settings 4 to 20 cm and good compliance about 7 hours per night with a residual AHI of 1/hour and mild leak.  She had a few missed days when she had cataract surgery  Epworth sleepiness score is 4 and she does report excessive tiredness during the day.  Husband complains that she is not as active as him. Bedtime is 11 PM, sleep latency can be 45 minutes, she sleeps on her back and rolls over to her side with 1 pillow, uses melatonin at bedtime, reports 1 nocturnal awakening without nocturia and is out of bed by 8:30 AM with dryness of mouth and feels tired as the day goes by. There is no history suggestive of cataplexy, sleep paralysis or parasomnias  Significant tests/ events reviewed  NPSG 07/2016 severe, AHI 43/hour, worse in supine sleep -185 pounds 10/2016 CPAP titration >> 7 cm small nasal mask   Past Medical History:  Diagnosis Date   HTN  (hypertension)    Hypercholesterolemia    Macular degeneration    Osteopenia    Past Surgical History:  Procedure Laterality Date   COLONOSCOPY N/A 01/13/2013   External hemorrhoids. 5 mm polyp (tubular adenoma) 10 cm from anal verge. Colonic diverticulosis.    COLONOSCOPY N/A 06/05/2019   Procedure: COLONOSCOPY;  Surgeon: Daneil Dolin, MD;  [facility];  Service: Endoscopy;  Laterality: N/A;  12:00pm    No Known Allergies  Social History   Socioeconomic History   Marital status: Married    Spouse name: Not on file   Number of children: Not on file   Years of education: Not on file   Highest education level: Not on file  Occupational History   Occupation: retired  Tobacco Use   Smoking status: Never   Smokeless tobacco: Never  Vaping Use   Vaping Use: Never used  Substance and Sexual Activity   Alcohol use: No   Drug use: No   Sexual activity: Not on file  Other Topics Concern   Not on file  Social History Narrative   Not on file   Social Determinants of Health   Financial Resource Strain: Not on file  Food Insecurity: Not on file  Transportation Needs: Not on file  Physical Activity: Not on file  Stress: Not on file  Social Connections: Not on file  Intimate Partner Violence: Not on file   ' Family History  Problem Relation Age of Onset   Colon cancer Sister 59       deceased at age 22      Review of Systems Acid heartburn Indigestion   Constitutional: negative for anorexia, fevers and sweats  Eyes: negative for irritation, redness and visual disturbance  Ears, nose, mouth, throat, and face: negative for earaches, epistaxis, nasal congestion and sore throat  Respiratory: negative for cough, dyspnea on exertion, sputum and wheezing  Cardiovascular: negative for chest pain, dyspnea, lower extremity edema, orthopnea, palpitations and syncope  Gastrointestinal: negative for abdominal pain, constipation, diarrhea, melena, nausea and  vomiting  Genitourinary:negative for dysuria, frequency and hematuria  Hematologic/lymphatic: negative for bleeding, easy bruising and lymphadenopathy  Musculoskeletal:negative for arthralgias, muscle weakness and stiff joints  Neurological: negative for coordination problems, gait problems, headaches and weakness  Endocrine: negative for diabetic symptoms including polydipsia, polyuria and weight loss     Objective:   Physical Exam  Gen. Pleasant, obese, in no distress, normal affect ENT - no pallor,icterus, no post nasal drip, class 2 airway Neck: No JVD, no thyromegaly, no carotid bruits Lungs: no use of accessory muscles, no dullness to percussion, decreased without rales or rhonchi  Cardiovascular: Rhythm regular, heart sounds  normal, no murmurs or gallops, no peripheral edema Abdomen: soft and non-tender, no hepatosplenomegaly, BS normal. Musculoskeletal: No deformities, no cyanosis or clubbing Neuro:  alert, non focal, no tremors       Assessment & Plan:

## 2020-10-13 NOTE — Telephone Encounter (Signed)
Called and spoke with patient to let her know of message from Dr. Elsworth Soho about OSA. Patient expressed understanding about continuing CPAP and doing mask fitting. Nothing further needed at this time.

## 2020-10-13 NOTE — Assessment & Plan Note (Addendum)
We will obtain her sleep studies from [facility] to understand the severity of her OSA. I reviewed CPAP download and her compliance is okay she does have a mild leak.  Her main issue is that she is struggling with the mask interface.  We will provide her unfortunately to have a mask desensitization session with our sleep tech in [place].  Hopefully we can find her a better fitting mask.  I reviewed AirFit F30 fullface mask with her and she seemed agreeable. We will decrease auto CPAP settings to 4 to 12 cm to avoid increased pressures We discussed alternative treatments for OSA including dental appliance -she does not want to pursue the current time and inspire implant which she was not very excited about  Weight loss encouraged, compliance with goal of at least 4-6 hrs every night is the expectation. Advised against medications with sedative side effects Cautioned against driving when sleepy - understanding that sleepiness will vary on a day to day basis

## 2020-10-13 NOTE — Patient Instructions (Signed)
Obtain sleep studies from [facility] CPAP machine is working well  Change settings to 4- 12 cm Call sleep lab in [place] [phone] & schedule mask fitting session  CPAP supplies will be renewed x 1 year

## 2020-10-21 DIAGNOSIS — H524 Presbyopia: Secondary | ICD-10-CM | POA: Diagnosis not present

## 2020-10-21 DIAGNOSIS — Z0100 Encounter for examination of eyes and vision without abnormal findings: Secondary | ICD-10-CM | POA: Diagnosis not present

## 2020-10-25 ENCOUNTER — Other Ambulatory Visit: Payer: Self-pay

## 2020-10-25 ENCOUNTER — Ambulatory Visit (HOSPITAL_BASED_OUTPATIENT_CLINIC_OR_DEPARTMENT_OTHER): Payer: Medicare HMO | Attending: Pulmonary Disease | Admitting: Pulmonary Disease

## 2020-10-25 DIAGNOSIS — G4733 Obstructive sleep apnea (adult) (pediatric): Secondary | ICD-10-CM

## 2020-10-26 DIAGNOSIS — K219 Gastro-esophageal reflux disease without esophagitis: Secondary | ICD-10-CM | POA: Diagnosis not present

## 2020-10-26 DIAGNOSIS — I10 Essential (primary) hypertension: Secondary | ICD-10-CM | POA: Diagnosis not present

## 2020-10-26 DIAGNOSIS — R7301 Impaired fasting glucose: Secondary | ICD-10-CM | POA: Diagnosis not present

## 2020-10-26 DIAGNOSIS — E7849 Other hyperlipidemia: Secondary | ICD-10-CM | POA: Diagnosis not present

## 2020-11-08 DIAGNOSIS — L821 Other seborrheic keratosis: Secondary | ICD-10-CM | POA: Diagnosis not present

## 2020-11-08 DIAGNOSIS — L570 Actinic keratosis: Secondary | ICD-10-CM | POA: Diagnosis not present

## 2020-11-08 DIAGNOSIS — D485 Neoplasm of uncertain behavior of skin: Secondary | ICD-10-CM | POA: Diagnosis not present

## 2020-11-08 DIAGNOSIS — L820 Inflamed seborrheic keratosis: Secondary | ICD-10-CM | POA: Diagnosis not present

## 2020-12-12 ENCOUNTER — Encounter (INDEPENDENT_AMBULATORY_CARE_PROVIDER_SITE_OTHER): Payer: Medicare HMO | Admitting: Ophthalmology

## 2020-12-14 ENCOUNTER — Ambulatory Visit (INDEPENDENT_AMBULATORY_CARE_PROVIDER_SITE_OTHER): Payer: Medicare HMO | Admitting: Ophthalmology

## 2020-12-14 ENCOUNTER — Other Ambulatory Visit: Payer: Self-pay

## 2020-12-14 ENCOUNTER — Encounter (INDEPENDENT_AMBULATORY_CARE_PROVIDER_SITE_OTHER): Payer: Self-pay | Admitting: Ophthalmology

## 2020-12-14 DIAGNOSIS — H353114 Nonexudative age-related macular degeneration, right eye, advanced atrophic with subfoveal involvement: Secondary | ICD-10-CM

## 2020-12-14 DIAGNOSIS — H353124 Nonexudative age-related macular degeneration, left eye, advanced atrophic with subfoveal involvement: Secondary | ICD-10-CM

## 2020-12-14 DIAGNOSIS — H401133 Primary open-angle glaucoma, bilateral, severe stage: Secondary | ICD-10-CM | POA: Diagnosis not present

## 2020-12-14 DIAGNOSIS — Z961 Presence of intraocular lens: Secondary | ICD-10-CM | POA: Diagnosis not present

## 2020-12-14 DIAGNOSIS — H353134 Nonexudative age-related macular degeneration, bilateral, advanced atrophic with subfoveal involvement: Secondary | ICD-10-CM | POA: Diagnosis not present

## 2020-12-14 NOTE — Progress Notes (Signed)
12/14/2020     CHIEF COMPLAINT Patient presents for No chief complaint on file.     HISTORY OF PRESENT ILLNESS: Sabrina Cantrell is a 80 y.o. female who presents to the clinic today for:   HPI   Patient underwent cataract surgery uncomplicated fashion with intraocular lens placement with Dr. Duanne Guess April on 1 eye and second eye May 2022with nice improvement in quality of vision And improvement in light,  Last edited by Hurman Horn, MD on 12/14/2020  3:18 PM.      Referring physician: Warden Fillers, MD [address] [place],  [place] [address]  HISTORICAL INFORMATION:   Selected notes from the MEDICAL RECORD NUMBER       CURRENT MEDICATIONS: Current Outpatient Medications (Ophthalmic Drugs)  Medication Sig   dorzolamide-timolol (COSOPT) 22.3-6.8 MG/ML ophthalmic solution Place 1 drop into both eyes 2 (two) times daily.   latanoprost (XALATAN) 0.005 % ophthalmic solution Place 1 drop into both eyes at bedtime.   No current facility-administered medications for this visit. (Ophthalmic Drugs)   Current Outpatient Medications (Other)  Medication Sig   acetaminophen (TYLENOL) 500 MG tablet Take 500 mg by mouth every 6 (six) hours as needed for moderate pain.   alendronate (FOSAMAX) 70 MG tablet Take 70 mg by mouth every Monday.    amLODipine (NORVASC) 5 MG tablet Take 5 mg by mouth daily.    Calcium Carb-Cholecalciferol (CALCIUM 600 + D PO) Take 1 tablet by mouth daily.   FIBER PO Take 2 capsules by mouth daily.   losartan-hydrochlorothiazide (HYZAAR) 50-12.5 MG tablet Take 1 tablet by mouth daily.   Melatonin 5 MG CAPS Take 5 mg by mouth at bedtime.   Multiple Vitamins-Minerals (PRESERVISION AREDS 2) CAPS Take 1 capsule by mouth 2 (two) times daily.   polyethylene glycol-electrolytes (TRILYTE) 420 g solution Take 4,000 mLs by mouth as directed.   simvastatin (ZOCOR) 40 MG tablet Take 40 mg by mouth every evening.   No current  facility-administered medications for this visit. (Other)      REVIEW OF SYSTEMS:    ALLERGIES No Known Allergies  PAST MEDICAL HISTORY Past Medical History:  Diagnosis Date   HTN (hypertension)    Hypercholesterolemia    Macular degeneration    Nuclear sclerotic cataract of both eyes 12/03/2019   Osteopenia    Past Surgical History:  Procedure Laterality Date   COLONOSCOPY N/A 01/13/2013   External hemorrhoids. 5 mm polyp (tubular adenoma) 10 cm from anal verge. Colonic diverticulosis.    COLONOSCOPY N/A 06/05/2019   Procedure: COLONOSCOPY;  Surgeon: Daneil Dolin, MD;  [facility];  Service: Endoscopy;  Laterality: N/A;  12:00pm    FAMILY HISTORY Family History  Problem Relation Age of Onset   Colon cancer Sister 29       deceased at age 4    SOCIAL HISTORY Social History   Tobacco Use   Smoking status: Never   Smokeless tobacco: Never  Vaping Use   Vaping Use: Never used  Substance Use Topics   Alcohol use: No   Drug use: No         OPHTHALMIC EXAM:  Base Eye Exam     Visual Acuity (ETDRS)       Right Left   Dist cc 20/300 20/300         Tonometry (Tonopen, 3:17 PM)       Right Left   Pressure 8 11  Neuro/Psych     Oriented x3: Yes   Mood/Affect: Normal         Dilation     Both eyes: 1.0% Mydriacyl, 2.5% Phenylephrine @ 3:17 PM           Slit Lamp and Fundus Exam     External Exam       Right Left   External Normal Normal         Slit Lamp Exam       Right Left   Lids/Lashes Normal Normal   Conjunctiva/Sclera White and quiet White and quiet   Cornea Clear Clear   Anterior Chamber Deep and quiet Deep and quiet   Iris Round and reactive Round and reactive   Lens Centered posterior chamber intraocular lens Centered posterior chamber intraocular lens   Anterior Vitreous Normal Normal         Fundus Exam       Right Left   Posterior Vitreous Posterior vitreous detachment Posterior  vitreous detachment   Disc 3+ Optic disc atrophy, 3+ Pallor 3+ Optic disc atrophy, 3+ Pallor   C/D Ratio 0.75 0.75   Macula Geographic atrophy in the FAZ, Advanced age related macular degeneration Geographic atrophy in the FAZ, Advanced age related macular degeneration   Vessels Normal Normal   Periphery Normal Normal            IMAGING AND PROCEDURES  Imaging and Procedures for 12/14/20  OCT, Retina - OU - Both Eyes       Right Eye Quality was good. Scan locations included subfoveal. Central Foveal Thickness: 185. Progression has been stable. Findings include no IRF, abnormal foveal contour, no SRF, outer retinal atrophy, central retinal atrophy, inner retinal atrophy.   Left Eye Quality was good. Scan locations included subfoveal. Central Foveal Thickness: 221. Progression has been stable. Findings include no SRF, abnormal foveal contour, no IRF, outer retinal atrophy, central retinal atrophy, inner retinal atrophy.   Notes No active CNVM OU, geographic atrophy of the RPE and retinal centrally.  Medial opacity             ASSESSMENT/PLAN:  Pseudophakia, both eyes Follow-up with Dr. Duanne Guess as scheduled  Advanced nonexudative age-related macular degeneration of right eye with subfoveal involvement The nature of dry age related macular degeneration was discussed with the patient as well as its possible conversion to wet. The results of the AREDS 2 study was discussed with the patient. A diet rich in dark leafy green vegetables was advised and specific recommendations were made regarding supplements with AREDS 2 formulation . Control of hypertension and serum cholesterol may slow the disease. Smoking cessation is mandatory to slow the disease and diminish the risk of progressing to wet age related macular degeneration. The patient was instructed in the use of an Wheeler and was told to return immediately for any changes in the Grid. Stressed to the patient do not  rub eyes  Primary open angle glaucoma of both eyes, severe stage IOP well controlled is optic atrophy and nerve cupping stable follow-up with Dr. Duanne Guess as scheduled  Advanced nonexudative age-related macular degeneration of left eye with subfoveal involvement Severe component of visual acuity yet stable overall     ICD-10-CM   1. Advanced nonexudative age-related macular degeneration of right eye with subfoveal involvement  H35.3114 OCT, Retina - OU - Both Eyes    2. Advanced nonexudative age-related macular degeneration of left eye with subfoveal involvement  H35.3124 OCT, Retina - OU -  Both Eyes    3. Pseudophakia, both eyes  Z96.1     4. Primary open angle glaucoma of both eyes, severe stage  H40.1133       1.  OU with subfoveal atrophy geographic atrophy dry AMD  2.  CME perifoveal OD, not impact on acuity will continue to monitor and observe  3.  Pseudophakia OU has improved sense of wellbeing and sense of vision.  Nonetheless he would be limited by severe OAG and subfoveal dry AMD  Ophthalmic Meds Ordered this visit:  No orders of the defined types were placed in this encounter.      Return in about 1 year (around 12/14/2021) for DILATE OU, COLOR FP, OCT.  There are no Patient Instructions on file for this visit.   Explained the diagnoses, plan, and follow up with the patient and they expressed understanding.  Patient expressed understanding of the importance of proper follow up care.   Clent Demark Aisling Emigh M.D. Diseases & Surgery of the Retina and Vitreous [facility] [place] 12/14/20     Abbreviations: M myopia (nearsighted); A astigmatism; H hyperopia (farsighted); P presbyopia; Mrx spectacle prescription;  CTL contact lenses; OD right eye; OS left eye; OU both eyes  XT exotropia; ET esotropia; PEK punctate epithelial keratitis; PEE punctate epithelial erosions; DES dry eye syndrome; MGD meibomian gland dysfunction; ATs artificial tears; PFAT's  preservative free artificial tears; [place] nuclear sclerotic cataract; PSC posterior subcapsular cataract; ERM epi-retinal membrane; PVD posterior vitreous detachment; RD retinal detachment; DM diabetes mellitus; DR diabetic retinopathy; NPDR non-proliferative diabetic retinopathy; PDR proliferative diabetic retinopathy; CSME clinically significant macular edema; DME diabetic macular edema; dbh dot blot hemorrhages; CWS cotton wool spot; POAG primary open angle glaucoma; C/D cup-to-disc ratio; HVF humphrey visual field; GVF goldmann visual field; OCT optical coherence tomography; IOP intraocular pressure; BRVO Branch retinal vein occlusion; CRVO central retinal vein occlusion; CRAO central retinal artery occlusion; BRAO branch retinal artery occlusion; RT retinal tear; SB scleral buckle; PPV pars plana vitrectomy; VH Vitreous hemorrhage; PRP panretinal laser photocoagulation; IVK intravitreal kenalog; VMT vitreomacular traction; MH Macular hole;  NVD neovascularization of the disc; NVE neovascularization elsewhere; AREDS age related eye disease study; ARMD age related macular degeneration; POAG primary open angle glaucoma; EBMD epithelial/anterior basement membrane dystrophy; ACIOL anterior chamber intraocular lens; IOL intraocular lens; PCIOL posterior chamber intraocular lens; Phaco/IOL phacoemulsification with intraocular lens placement; [place] photorefractive keratectomy; LASIK laser assisted in situ keratomileusis; HTN hypertension; DM diabetes mellitus; COPD chronic obstructive pulmonary disease

## 2020-12-14 NOTE — Assessment & Plan Note (Signed)
Severe component of visual acuity yet stable overall

## 2020-12-14 NOTE — Assessment & Plan Note (Signed)
IOP well controlled is optic atrophy and nerve cupping stable follow-up with Dr. Duanne Guess as scheduled

## 2020-12-14 NOTE — Assessment & Plan Note (Signed)
Follow-up with Dr. Duanne Guess as scheduled

## 2020-12-14 NOTE — Assessment & Plan Note (Signed)

## 2020-12-19 DIAGNOSIS — I10 Essential (primary) hypertension: Secondary | ICD-10-CM | POA: Diagnosis not present

## 2020-12-19 DIAGNOSIS — E8881 Metabolic syndrome: Secondary | ICD-10-CM | POA: Diagnosis not present

## 2020-12-19 DIAGNOSIS — R7301 Impaired fasting glucose: Secondary | ICD-10-CM | POA: Diagnosis not present

## 2020-12-19 DIAGNOSIS — E782 Mixed hyperlipidemia: Secondary | ICD-10-CM | POA: Diagnosis not present

## 2020-12-19 DIAGNOSIS — K2100 Gastro-esophageal reflux disease with esophagitis, without bleeding: Secondary | ICD-10-CM | POA: Diagnosis not present

## 2020-12-19 DIAGNOSIS — R5383 Other fatigue: Secondary | ICD-10-CM | POA: Diagnosis not present

## 2020-12-21 DIAGNOSIS — I10 Essential (primary) hypertension: Secondary | ICD-10-CM | POA: Diagnosis not present

## 2020-12-21 DIAGNOSIS — H409 Unspecified glaucoma: Secondary | ICD-10-CM | POA: Diagnosis not present

## 2020-12-21 DIAGNOSIS — Z23 Encounter for immunization: Secondary | ICD-10-CM | POA: Diagnosis not present

## 2020-12-21 DIAGNOSIS — R7301 Impaired fasting glucose: Secondary | ICD-10-CM | POA: Diagnosis not present

## 2020-12-21 DIAGNOSIS — H269 Unspecified cataract: Secondary | ICD-10-CM | POA: Diagnosis not present

## 2020-12-21 DIAGNOSIS — E7849 Other hyperlipidemia: Secondary | ICD-10-CM | POA: Diagnosis not present

## 2020-12-21 DIAGNOSIS — Z0001 Encounter for general adult medical examination with abnormal findings: Secondary | ICD-10-CM | POA: Diagnosis not present

## 2020-12-21 DIAGNOSIS — E8881 Metabolic syndrome: Secondary | ICD-10-CM | POA: Diagnosis not present

## 2021-01-26 IMAGING — MG MM BREAST LOCALIZATION CLIP
4 series · 4 of 12 positions shown · non-contrast
Comparison: Previous exam(s).

CLINICAL DATA: 78-year-old female presenting for biopsy of possible
left breast distortion/asymmetry.

EXAM:
DIAGNOSTIC LEFT MAMMOGRAM POST STEREOTACTIC BIOPSY

[L CC synth-2D]
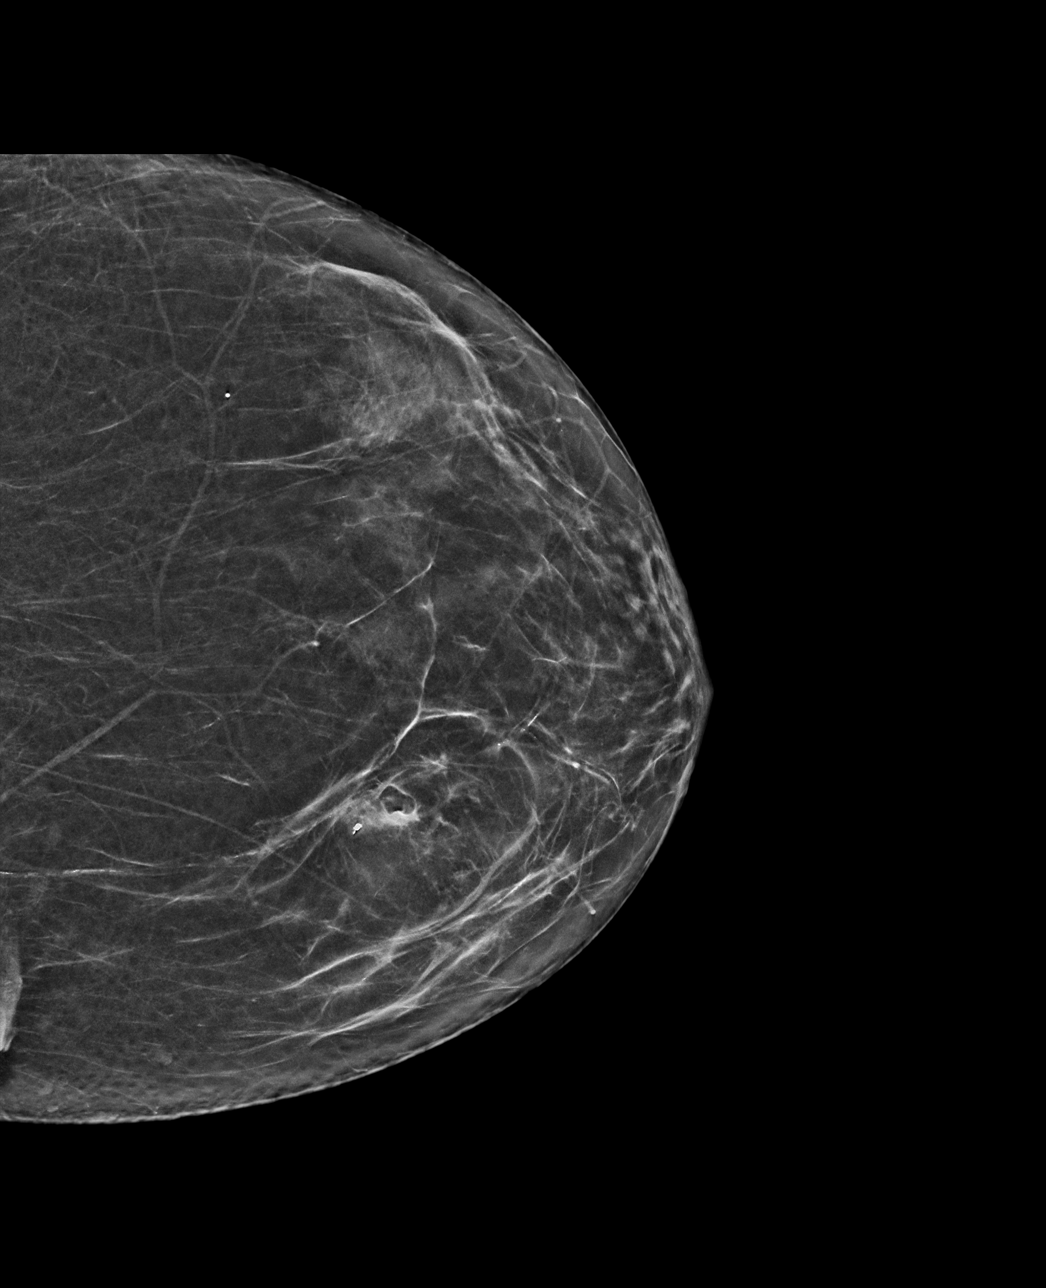

[L ML synth-2D]
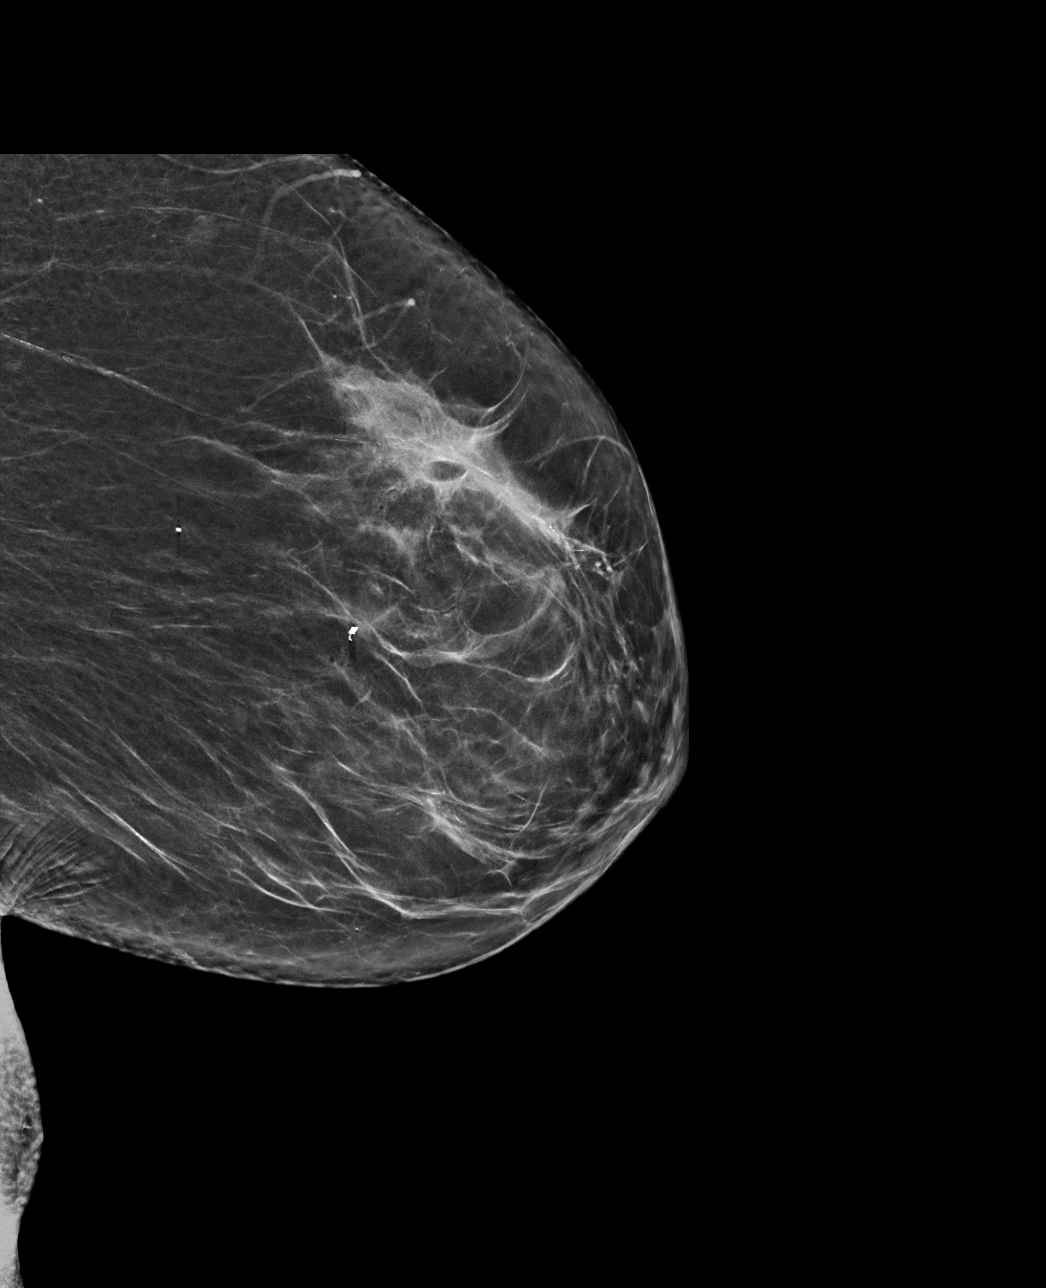

[L CC tomo · tomo slice 31/61.0]
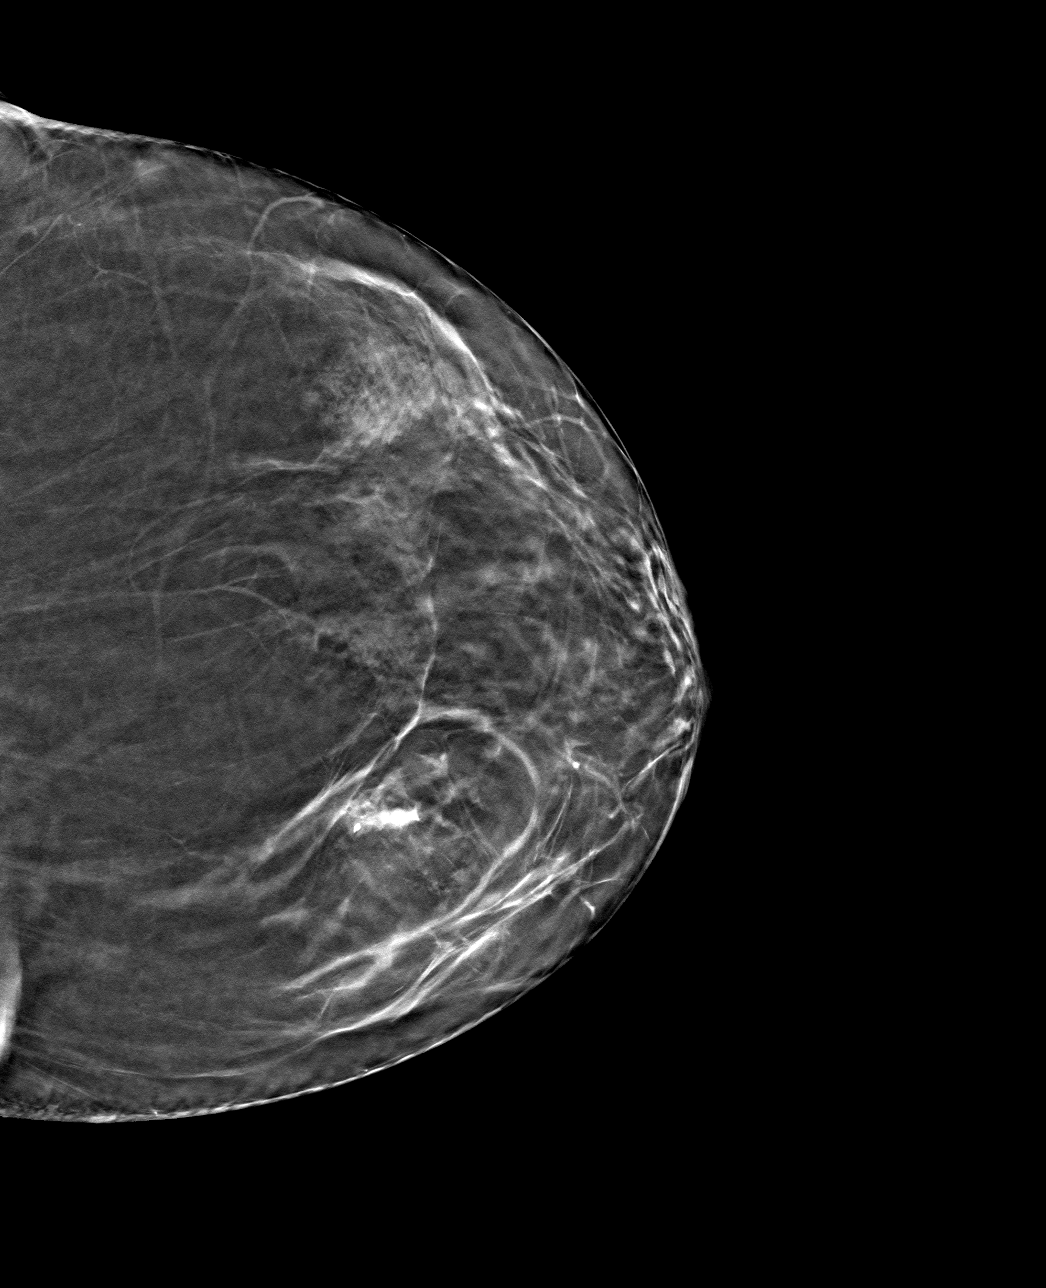

[L ML tomo · tomo slice 35/68.0]
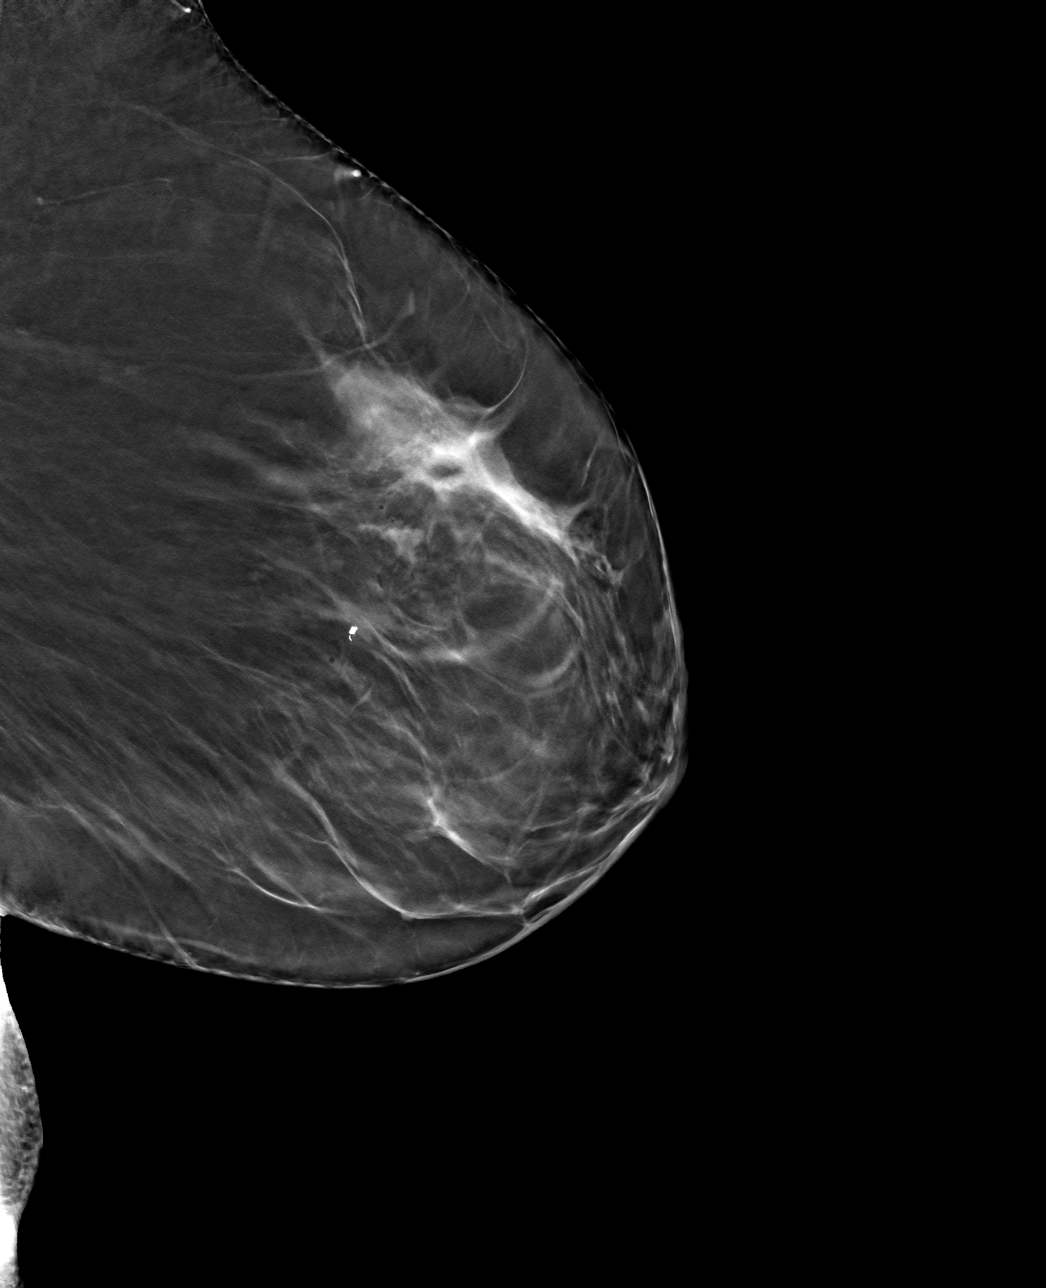

[4 of 12 positions shown; findings below may reference images not displayed]

FINDINGS: Mammographic images were obtained following stereotactic guided
biopsy of possible asymmetry/distortion in the medial left breast.
The coil biopsy marking clip is in expected position at the site of
biopsy.
IMPRESSION: Appropriate positioning of the coil shaped biopsy marking clip at
the site of biopsy in the medial left breast.

Final Assessment: Post Procedure Mammograms for Marker Placement

## 2021-01-26 IMAGING — MG MM BREAST BX W LOC DEV 1ST LESION IMAGE BX SPEC STEREO GUIDE*L*
8 of 15 series · 8 of 23 positions shown · non-contrast
Comparison: Previous exams.
COMPARISON: Previous exams.

Addendum:
CLINICAL DATA: 78-year-old female presenting for biopsy of possible
distortion/asymmetry in the medial left breast

EXAM:
LEFT BREAST STEREOTACTIC CORE NEEDLE BIOPSY

[L (1 of 8)]
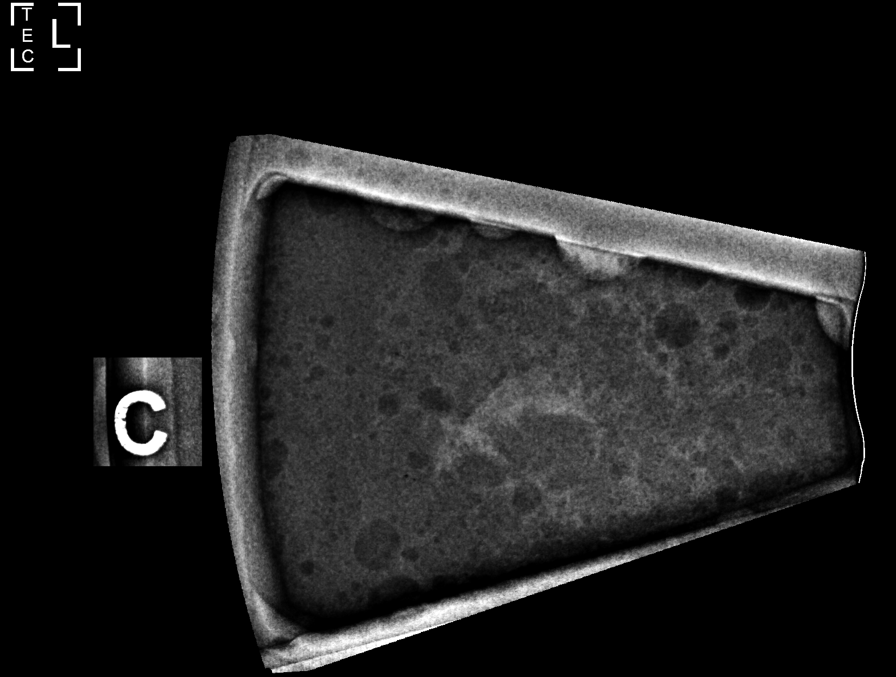

[L (2 of 8)]
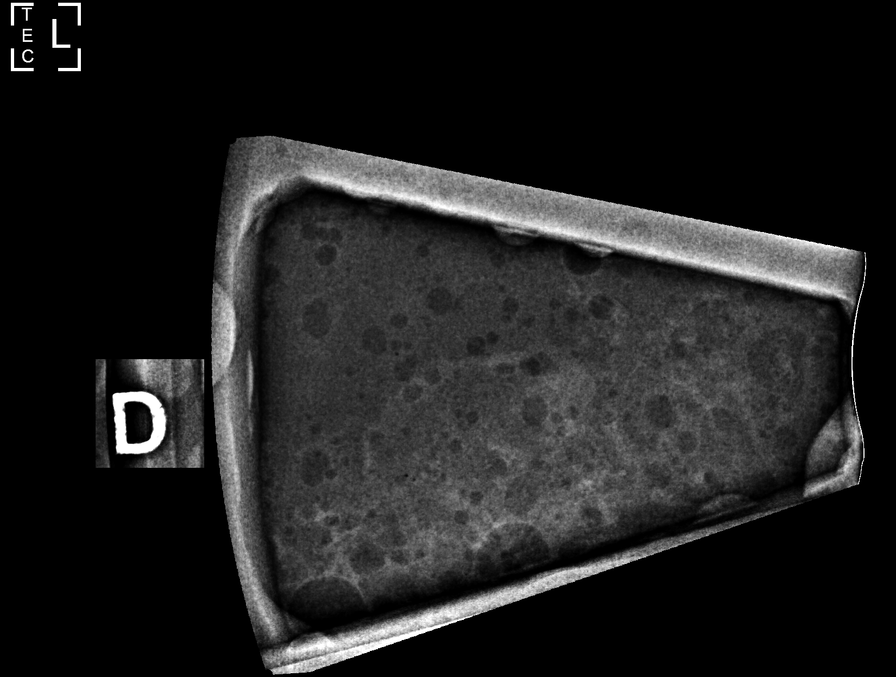

[L (3 of 8)]
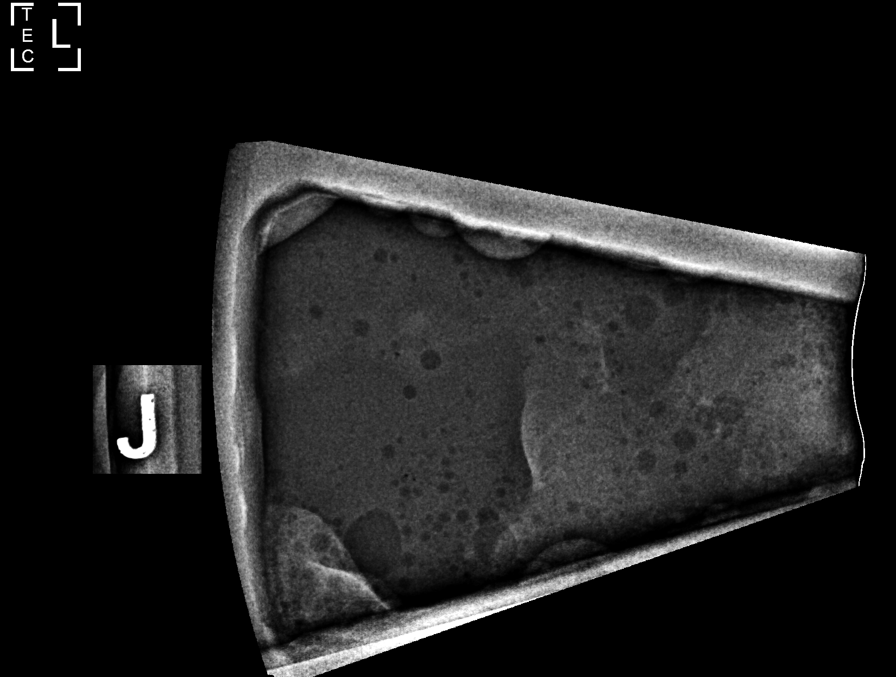

[L (4 of 8)]
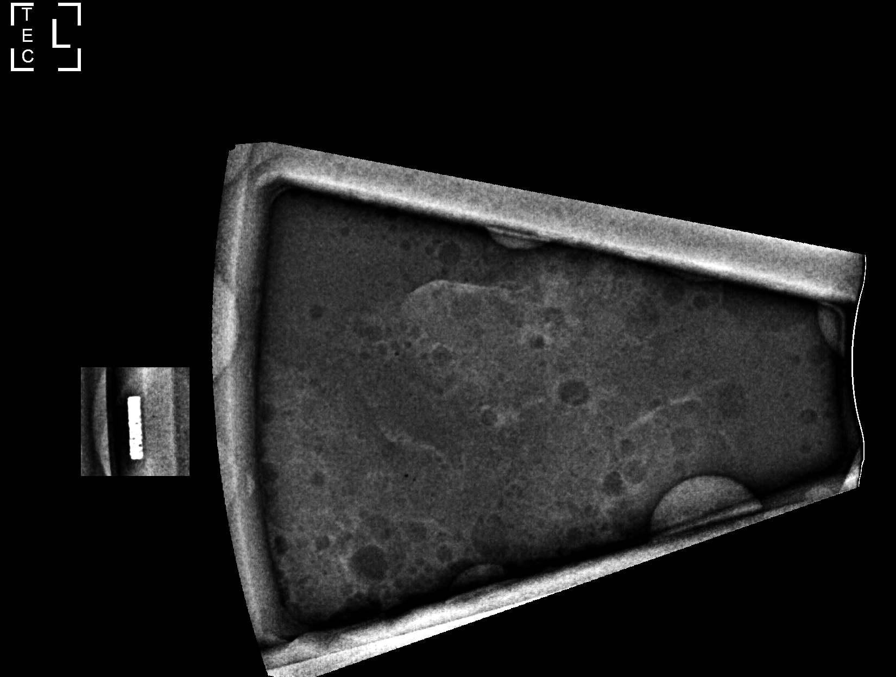

[L (5 of 8)]
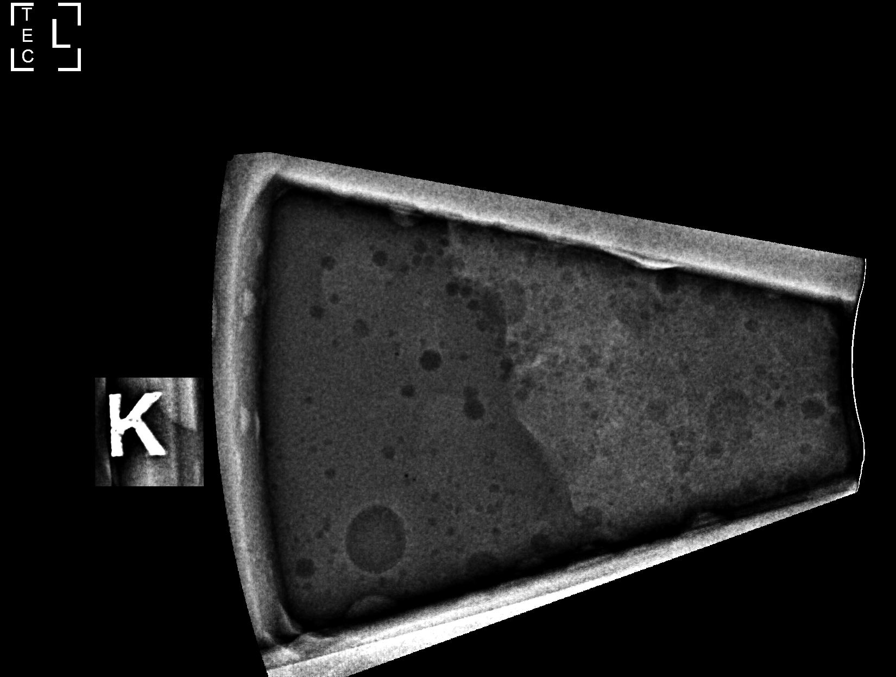

[L (6 of 8)]
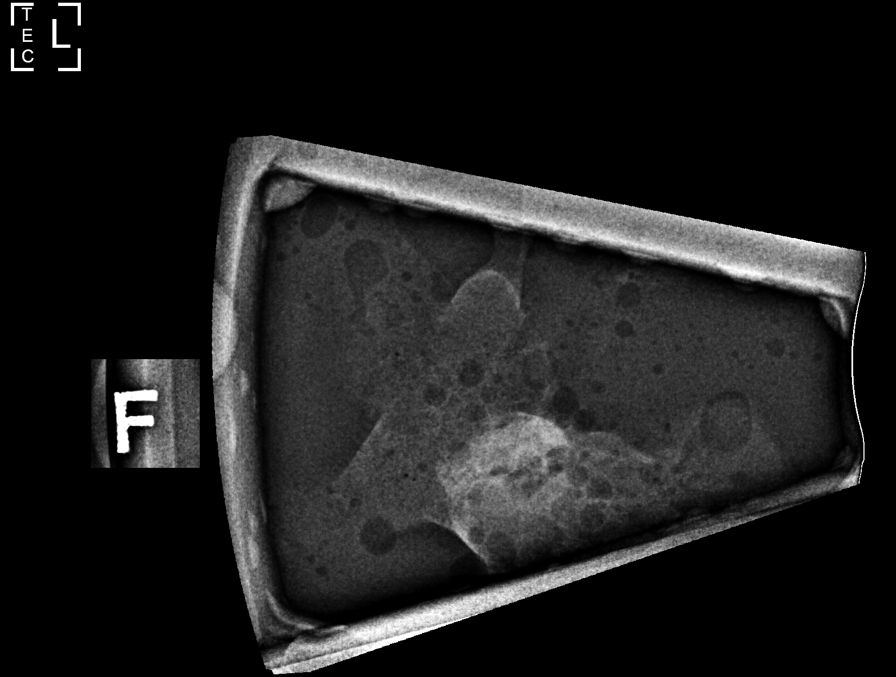

[L (7 of 8)]
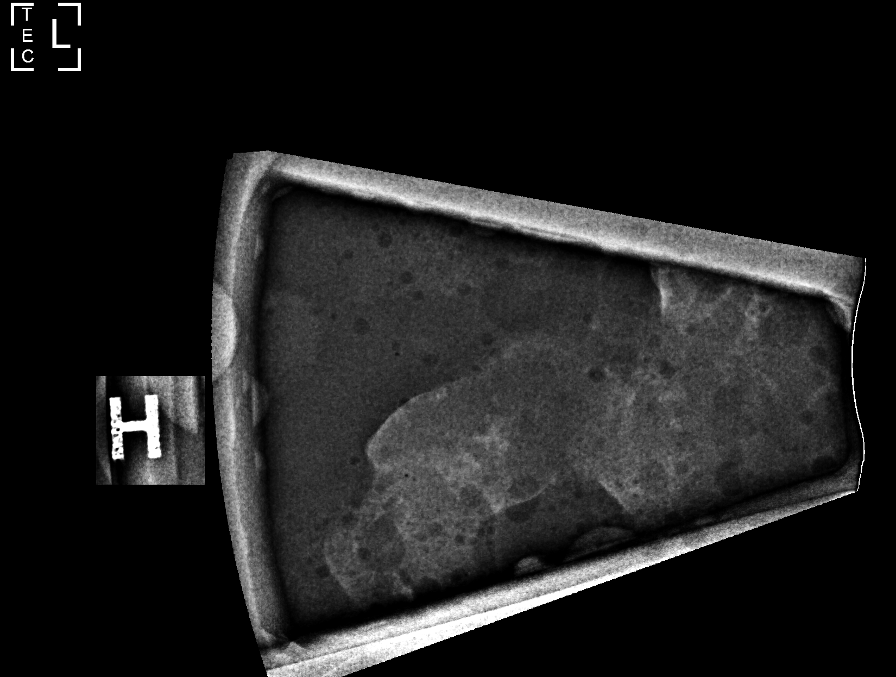

[L (8 of 8)]
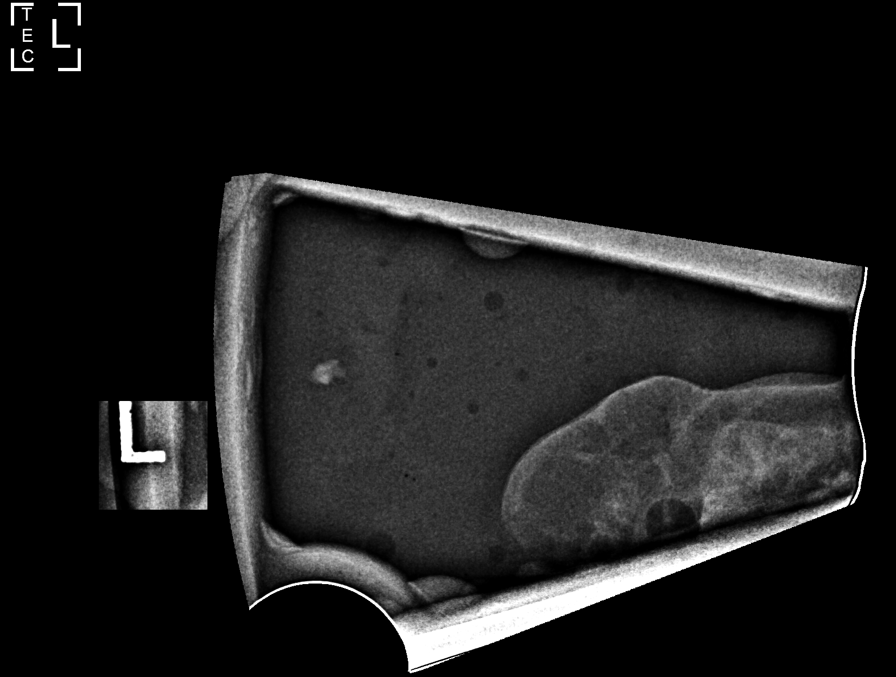

[8 of 23 positions shown; findings below may reference images not displayed]



Using sterile technique and 1% Lidocaine as local anesthetic, under
stereotactic guidance, a 9 gauge vacuum assisted device was used to
perform core needle biopsy of possible distortion/asymmetry in the
medial left breast using a superior approach.

Lesion quadrant: Upper inner quadrant

At the conclusion of the procedure, the coil tissue marker clip was
deployed into the biopsy cavity. Follow-up 2-view mammogram was
performed and dictated separately.
IMPRESSION: Stereotactic-guided biopsy of possible distortion/asymmetry in the
medial left breast. No apparent complications.

ADDENDUM:
Pathology revealed FIBROCYSTIC CHANGES WITH CALCIFICATIONS, APOCRINE
METAPLASIA AND FOCAL CHRONIC INFLAMMATION of the Left breast,
medial. This was found to be concordant by Dr. Kiphosus Delmee.

Pathology results were discussed with the patient by telephone by
Gongadze Kyuregyan, RN, Nurse Navigator. The patient reported doing well
after the biopsy with tenderness at the site. Post biopsy
instructions and care were reviewed and questions were answered. The
patient was encouraged to call [REDACTED] for any additional concerns.

The patient was asked to return for Left diagnostic mammography and
possible ultrasound in 6 months at [HOSPITAL] [facility] in [HOSPITAL][HOSPITAL].

Pathology results reported by Paulus N Ceejay, RN on 05/21/2019.



Using sterile technique and 1% Lidocaine as local anesthetic, under
stereotactic guidance, a 9 gauge vacuum assisted device was used to
perform core needle biopsy of possible distortion/asymmetry in the
medial left breast using a superior approach.

Lesion quadrant: Upper inner quadrant

At the conclusion of the procedure, the coil tissue marker clip was
deployed into the biopsy cavity. Follow-up 2-view mammogram was
performed and dictated separately.
IMPRESSION: Stereotactic-guided biopsy of possible distortion/asymmetry in the
medial left breast. No apparent complications.

## 2021-02-24 DIAGNOSIS — K219 Gastro-esophageal reflux disease without esophagitis: Secondary | ICD-10-CM | POA: Diagnosis not present

## 2021-02-24 DIAGNOSIS — R7301 Impaired fasting glucose: Secondary | ICD-10-CM | POA: Diagnosis not present

## 2021-02-24 DIAGNOSIS — I10 Essential (primary) hypertension: Secondary | ICD-10-CM | POA: Diagnosis not present

## 2021-02-24 DIAGNOSIS — E7849 Other hyperlipidemia: Secondary | ICD-10-CM | POA: Diagnosis not present

## 2021-05-29 ENCOUNTER — Encounter (INDEPENDENT_AMBULATORY_CARE_PROVIDER_SITE_OTHER): Payer: Self-pay

## 2021-05-29 DIAGNOSIS — H401132 Primary open-angle glaucoma, bilateral, moderate stage: Secondary | ICD-10-CM | POA: Diagnosis not present

## 2021-05-29 DIAGNOSIS — Z961 Presence of intraocular lens: Secondary | ICD-10-CM | POA: Diagnosis not present

## 2021-05-29 DIAGNOSIS — H2511 Age-related nuclear cataract, right eye: Secondary | ICD-10-CM | POA: Diagnosis not present

## 2021-05-29 DIAGNOSIS — H353124 Nonexudative age-related macular degeneration, left eye, advanced atrophic with subfoveal involvement: Secondary | ICD-10-CM | POA: Diagnosis not present

## 2021-05-29 DIAGNOSIS — H353211 Exudative age-related macular degeneration, right eye, with active choroidal neovascularization: Secondary | ICD-10-CM | POA: Diagnosis not present

## 2021-06-12 DIAGNOSIS — I10 Essential (primary) hypertension: Secondary | ICD-10-CM | POA: Diagnosis not present

## 2021-06-12 DIAGNOSIS — E782 Mixed hyperlipidemia: Secondary | ICD-10-CM | POA: Diagnosis not present

## 2021-06-12 DIAGNOSIS — K219 Gastro-esophageal reflux disease without esophagitis: Secondary | ICD-10-CM | POA: Diagnosis not present

## 2021-06-12 DIAGNOSIS — R7301 Impaired fasting glucose: Secondary | ICD-10-CM | POA: Diagnosis not present

## 2021-06-12 DIAGNOSIS — E7849 Other hyperlipidemia: Secondary | ICD-10-CM | POA: Diagnosis not present

## 2021-06-29 DIAGNOSIS — E7849 Other hyperlipidemia: Secondary | ICD-10-CM | POA: Diagnosis not present

## 2021-06-29 DIAGNOSIS — I10 Essential (primary) hypertension: Secondary | ICD-10-CM | POA: Diagnosis not present

## 2021-06-29 DIAGNOSIS — J329 Chronic sinusitis, unspecified: Secondary | ICD-10-CM | POA: Diagnosis not present

## 2021-06-29 DIAGNOSIS — Z23 Encounter for immunization: Secondary | ICD-10-CM | POA: Diagnosis not present

## 2021-06-29 DIAGNOSIS — H269 Unspecified cataract: Secondary | ICD-10-CM | POA: Diagnosis not present

## 2021-06-29 DIAGNOSIS — E8881 Metabolic syndrome: Secondary | ICD-10-CM | POA: Diagnosis not present

## 2021-06-29 DIAGNOSIS — H409 Unspecified glaucoma: Secondary | ICD-10-CM | POA: Diagnosis not present

## 2021-06-29 DIAGNOSIS — R7301 Impaired fasting glucose: Secondary | ICD-10-CM | POA: Diagnosis not present

## 2021-09-05 DIAGNOSIS — Z1231 Encounter for screening mammogram for malignant neoplasm of breast: Secondary | ICD-10-CM | POA: Diagnosis not present

## 2021-10-03 ENCOUNTER — Encounter (INDEPENDENT_AMBULATORY_CARE_PROVIDER_SITE_OTHER): Payer: Self-pay

## 2021-10-03 DIAGNOSIS — Z961 Presence of intraocular lens: Secondary | ICD-10-CM | POA: Diagnosis not present

## 2021-10-03 DIAGNOSIS — H353211 Exudative age-related macular degeneration, right eye, with active choroidal neovascularization: Secondary | ICD-10-CM | POA: Diagnosis not present

## 2021-10-03 DIAGNOSIS — H401132 Primary open-angle glaucoma, bilateral, moderate stage: Secondary | ICD-10-CM | POA: Diagnosis not present

## 2021-10-03 DIAGNOSIS — H353124 Nonexudative age-related macular degeneration, left eye, advanced atrophic with subfoveal involvement: Secondary | ICD-10-CM | POA: Diagnosis not present

## 2021-11-08 DIAGNOSIS — D239 Other benign neoplasm of skin, unspecified: Secondary | ICD-10-CM | POA: Diagnosis not present

## 2021-11-08 DIAGNOSIS — Z85828 Personal history of other malignant neoplasm of skin: Secondary | ICD-10-CM | POA: Diagnosis not present

## 2021-11-08 DIAGNOSIS — L570 Actinic keratosis: Secondary | ICD-10-CM | POA: Diagnosis not present

## 2021-12-14 ENCOUNTER — Encounter (INDEPENDENT_AMBULATORY_CARE_PROVIDER_SITE_OTHER): Payer: Medicare HMO | Admitting: Ophthalmology

## 2022-01-02 DIAGNOSIS — K219 Gastro-esophageal reflux disease without esophagitis: Secondary | ICD-10-CM | POA: Diagnosis not present

## 2022-01-02 DIAGNOSIS — E782 Mixed hyperlipidemia: Secondary | ICD-10-CM | POA: Diagnosis not present

## 2022-01-02 DIAGNOSIS — Z0001 Encounter for general adult medical examination with abnormal findings: Secondary | ICD-10-CM | POA: Diagnosis not present

## 2022-01-02 DIAGNOSIS — E7849 Other hyperlipidemia: Secondary | ICD-10-CM | POA: Diagnosis not present

## 2022-01-02 DIAGNOSIS — R7301 Impaired fasting glucose: Secondary | ICD-10-CM | POA: Diagnosis not present

## 2022-01-02 DIAGNOSIS — I10 Essential (primary) hypertension: Secondary | ICD-10-CM | POA: Diagnosis not present

## 2022-01-02 DIAGNOSIS — Z1329 Encounter for screening for other suspected endocrine disorder: Secondary | ICD-10-CM | POA: Diagnosis not present

## 2022-01-09 DIAGNOSIS — K2100 Gastro-esophageal reflux disease with esophagitis, without bleeding: Secondary | ICD-10-CM | POA: Diagnosis not present

## 2022-01-09 DIAGNOSIS — G4733 Obstructive sleep apnea (adult) (pediatric): Secondary | ICD-10-CM | POA: Diagnosis not present

## 2022-01-09 DIAGNOSIS — H409 Unspecified glaucoma: Secondary | ICD-10-CM | POA: Diagnosis not present

## 2022-01-09 DIAGNOSIS — R7301 Impaired fasting glucose: Secondary | ICD-10-CM | POA: Diagnosis not present

## 2022-01-09 DIAGNOSIS — Z0001 Encounter for general adult medical examination with abnormal findings: Secondary | ICD-10-CM | POA: Diagnosis not present

## 2022-01-09 DIAGNOSIS — E8881 Metabolic syndrome: Secondary | ICD-10-CM | POA: Diagnosis not present

## 2022-01-09 DIAGNOSIS — H269 Unspecified cataract: Secondary | ICD-10-CM | POA: Diagnosis not present

## 2022-01-09 DIAGNOSIS — I10 Essential (primary) hypertension: Secondary | ICD-10-CM | POA: Diagnosis not present

## 2022-01-09 DIAGNOSIS — E7849 Other hyperlipidemia: Secondary | ICD-10-CM | POA: Diagnosis not present

## 2022-02-13 DIAGNOSIS — H401133 Primary open-angle glaucoma, bilateral, severe stage: Secondary | ICD-10-CM | POA: Diagnosis not present

## 2022-02-13 DIAGNOSIS — H353114 Nonexudative age-related macular degeneration, right eye, advanced atrophic with subfoveal involvement: Secondary | ICD-10-CM | POA: Diagnosis not present

## 2022-02-13 DIAGNOSIS — H353124 Nonexudative age-related macular degeneration, left eye, advanced atrophic with subfoveal involvement: Secondary | ICD-10-CM | POA: Diagnosis not present

## 2022-02-13 DIAGNOSIS — H353211 Exudative age-related macular degeneration, right eye, with active choroidal neovascularization: Secondary | ICD-10-CM | POA: Diagnosis not present

## 2022-02-13 DIAGNOSIS — H353212 Exudative age-related macular degeneration, right eye, with inactive choroidal neovascularization: Secondary | ICD-10-CM | POA: Diagnosis not present

## 2022-02-14 DIAGNOSIS — K4090 Unilateral inguinal hernia, without obstruction or gangrene, not specified as recurrent: Secondary | ICD-10-CM | POA: Diagnosis not present

## 2022-02-14 DIAGNOSIS — R030 Elevated blood-pressure reading, without diagnosis of hypertension: Secondary | ICD-10-CM | POA: Diagnosis not present

## 2022-02-14 DIAGNOSIS — Z6833 Body mass index (BMI) 33.0-33.9, adult: Secondary | ICD-10-CM | POA: Diagnosis not present

## 2022-02-22 DIAGNOSIS — R10819 Abdominal tenderness, unspecified site: Secondary | ICD-10-CM | POA: Diagnosis not present

## 2022-02-22 DIAGNOSIS — R1031 Right lower quadrant pain: Secondary | ICD-10-CM | POA: Diagnosis not present

## 2022-03-01 DIAGNOSIS — K429 Umbilical hernia without obstruction or gangrene: Secondary | ICD-10-CM | POA: Diagnosis not present

## 2022-03-01 DIAGNOSIS — K4090 Unilateral inguinal hernia, without obstruction or gangrene, not specified as recurrent: Secondary | ICD-10-CM | POA: Diagnosis not present

## 2022-03-01 DIAGNOSIS — K5730 Diverticulosis of large intestine without perforation or abscess without bleeding: Secondary | ICD-10-CM | POA: Diagnosis not present

## 2022-03-01 DIAGNOSIS — R1031 Right lower quadrant pain: Secondary | ICD-10-CM | POA: Diagnosis not present

## 2022-03-06 DIAGNOSIS — K4090 Unilateral inguinal hernia, without obstruction or gangrene, not specified as recurrent: Secondary | ICD-10-CM | POA: Diagnosis not present

## 2022-03-06 DIAGNOSIS — R9389 Abnormal findings on diagnostic imaging of other specified body structures: Secondary | ICD-10-CM | POA: Diagnosis not present

## 2022-03-13 DIAGNOSIS — K4090 Unilateral inguinal hernia, without obstruction or gangrene, not specified as recurrent: Secondary | ICD-10-CM | POA: Diagnosis not present

## 2022-03-13 DIAGNOSIS — R109 Unspecified abdominal pain: Secondary | ICD-10-CM | POA: Diagnosis not present

## 2022-03-13 DIAGNOSIS — N888 Other specified noninflammatory disorders of cervix uteri: Secondary | ICD-10-CM | POA: Diagnosis not present

## 2022-03-13 DIAGNOSIS — R9389 Abnormal findings on diagnostic imaging of other specified body structures: Secondary | ICD-10-CM | POA: Diagnosis not present

## 2022-04-03 DIAGNOSIS — H353212 Exudative age-related macular degeneration, right eye, with inactive choroidal neovascularization: Secondary | ICD-10-CM | POA: Diagnosis not present

## 2022-04-03 DIAGNOSIS — H401133 Primary open-angle glaucoma, bilateral, severe stage: Secondary | ICD-10-CM | POA: Diagnosis not present

## 2022-04-03 DIAGNOSIS — H353124 Nonexudative age-related macular degeneration, left eye, advanced atrophic with subfoveal involvement: Secondary | ICD-10-CM | POA: Diagnosis not present

## 2022-04-03 DIAGNOSIS — H353211 Exudative age-related macular degeneration, right eye, with active choroidal neovascularization: Secondary | ICD-10-CM | POA: Diagnosis not present

## 2022-04-03 DIAGNOSIS — H353114 Nonexudative age-related macular degeneration, right eye, advanced atrophic with subfoveal involvement: Secondary | ICD-10-CM | POA: Diagnosis not present

## 2022-04-10 DIAGNOSIS — Z79899 Other long term (current) drug therapy: Secondary | ICD-10-CM | POA: Diagnosis not present

## 2022-04-10 DIAGNOSIS — I10 Essential (primary) hypertension: Secondary | ICD-10-CM | POA: Diagnosis not present

## 2022-04-10 DIAGNOSIS — Z7983 Long term (current) use of bisphosphonates: Secondary | ICD-10-CM | POA: Diagnosis not present

## 2022-04-10 DIAGNOSIS — R9389 Abnormal findings on diagnostic imaging of other specified body structures: Secondary | ICD-10-CM | POA: Diagnosis not present

## 2022-04-10 DIAGNOSIS — Z124 Encounter for screening for malignant neoplasm of cervix: Secondary | ICD-10-CM | POA: Diagnosis not present

## 2022-04-10 DIAGNOSIS — H40009 Preglaucoma, unspecified, unspecified eye: Secondary | ICD-10-CM | POA: Diagnosis not present

## 2022-04-17 DIAGNOSIS — H353211 Exudative age-related macular degeneration, right eye, with active choroidal neovascularization: Secondary | ICD-10-CM | POA: Diagnosis not present

## 2022-04-17 DIAGNOSIS — H353124 Nonexudative age-related macular degeneration, left eye, advanced atrophic with subfoveal involvement: Secondary | ICD-10-CM | POA: Diagnosis not present

## 2022-04-17 DIAGNOSIS — H401132 Primary open-angle glaucoma, bilateral, moderate stage: Secondary | ICD-10-CM | POA: Diagnosis not present

## 2022-04-17 DIAGNOSIS — Z961 Presence of intraocular lens: Secondary | ICD-10-CM | POA: Diagnosis not present

## 2022-04-19 DIAGNOSIS — R9389 Abnormal findings on diagnostic imaging of other specified body structures: Secondary | ICD-10-CM | POA: Diagnosis not present

## 2022-05-08 DIAGNOSIS — H401133 Primary open-angle glaucoma, bilateral, severe stage: Secondary | ICD-10-CM | POA: Diagnosis not present

## 2022-05-08 DIAGNOSIS — H353134 Nonexudative age-related macular degeneration, bilateral, advanced atrophic with subfoveal involvement: Secondary | ICD-10-CM | POA: Diagnosis not present

## 2022-05-08 DIAGNOSIS — H353212 Exudative age-related macular degeneration, right eye, with inactive choroidal neovascularization: Secondary | ICD-10-CM | POA: Diagnosis not present

## 2022-05-08 DIAGNOSIS — H353211 Exudative age-related macular degeneration, right eye, with active choroidal neovascularization: Secondary | ICD-10-CM | POA: Diagnosis not present

## 2022-05-09 DIAGNOSIS — Z961 Presence of intraocular lens: Secondary | ICD-10-CM | POA: Diagnosis not present

## 2022-05-09 DIAGNOSIS — H353211 Exudative age-related macular degeneration, right eye, with active choroidal neovascularization: Secondary | ICD-10-CM | POA: Diagnosis not present

## 2022-05-09 DIAGNOSIS — H353124 Nonexudative age-related macular degeneration, left eye, advanced atrophic with subfoveal involvement: Secondary | ICD-10-CM | POA: Diagnosis not present

## 2022-05-09 DIAGNOSIS — H401132 Primary open-angle glaucoma, bilateral, moderate stage: Secondary | ICD-10-CM | POA: Diagnosis not present

## 2022-06-12 DIAGNOSIS — H353124 Nonexudative age-related macular degeneration, left eye, advanced atrophic with subfoveal involvement: Secondary | ICD-10-CM | POA: Diagnosis not present

## 2022-06-12 DIAGNOSIS — H353114 Nonexudative age-related macular degeneration, right eye, advanced atrophic with subfoveal involvement: Secondary | ICD-10-CM | POA: Diagnosis not present

## 2022-06-12 DIAGNOSIS — H353211 Exudative age-related macular degeneration, right eye, with active choroidal neovascularization: Secondary | ICD-10-CM | POA: Diagnosis not present

## 2022-06-12 DIAGNOSIS — H401133 Primary open-angle glaucoma, bilateral, severe stage: Secondary | ICD-10-CM | POA: Diagnosis not present

## 2022-06-14 DIAGNOSIS — K4090 Unilateral inguinal hernia, without obstruction or gangrene, not specified as recurrent: Secondary | ICD-10-CM | POA: Diagnosis not present

## 2022-06-22 DIAGNOSIS — H353211 Exudative age-related macular degeneration, right eye, with active choroidal neovascularization: Secondary | ICD-10-CM | POA: Diagnosis not present

## 2022-06-22 DIAGNOSIS — H353124 Nonexudative age-related macular degeneration, left eye, advanced atrophic with subfoveal involvement: Secondary | ICD-10-CM | POA: Diagnosis not present

## 2022-06-22 DIAGNOSIS — Z961 Presence of intraocular lens: Secondary | ICD-10-CM | POA: Diagnosis not present

## 2022-07-03 DIAGNOSIS — H353124 Nonexudative age-related macular degeneration, left eye, advanced atrophic with subfoveal involvement: Secondary | ICD-10-CM | POA: Diagnosis not present

## 2022-07-03 DIAGNOSIS — H401133 Primary open-angle glaucoma, bilateral, severe stage: Secondary | ICD-10-CM | POA: Diagnosis not present

## 2022-07-03 DIAGNOSIS — H353211 Exudative age-related macular degeneration, right eye, with active choroidal neovascularization: Secondary | ICD-10-CM | POA: Diagnosis not present

## 2022-07-03 DIAGNOSIS — H353114 Nonexudative age-related macular degeneration, right eye, advanced atrophic with subfoveal involvement: Secondary | ICD-10-CM | POA: Diagnosis not present

## 2022-07-04 DIAGNOSIS — K429 Umbilical hernia without obstruction or gangrene: Secondary | ICD-10-CM | POA: Diagnosis not present

## 2022-07-04 DIAGNOSIS — K4090 Unilateral inguinal hernia, without obstruction or gangrene, not specified as recurrent: Secondary | ICD-10-CM | POA: Diagnosis not present

## 2022-07-09 DIAGNOSIS — K219 Gastro-esophageal reflux disease without esophagitis: Secondary | ICD-10-CM | POA: Diagnosis not present

## 2022-07-09 DIAGNOSIS — R7301 Impaired fasting glucose: Secondary | ICD-10-CM | POA: Diagnosis not present

## 2022-07-09 DIAGNOSIS — E7849 Other hyperlipidemia: Secondary | ICD-10-CM | POA: Diagnosis not present

## 2022-07-16 DIAGNOSIS — R7301 Impaired fasting glucose: Secondary | ICD-10-CM | POA: Diagnosis not present

## 2022-07-16 DIAGNOSIS — H409 Unspecified glaucoma: Secondary | ICD-10-CM | POA: Diagnosis not present

## 2022-07-16 DIAGNOSIS — I10 Essential (primary) hypertension: Secondary | ICD-10-CM | POA: Diagnosis not present

## 2022-07-16 DIAGNOSIS — K2100 Gastro-esophageal reflux disease with esophagitis, without bleeding: Secondary | ICD-10-CM | POA: Diagnosis not present

## 2022-07-16 DIAGNOSIS — E7849 Other hyperlipidemia: Secondary | ICD-10-CM | POA: Diagnosis not present

## 2022-07-16 DIAGNOSIS — E6609 Other obesity due to excess calories: Secondary | ICD-10-CM | POA: Diagnosis not present

## 2022-07-16 DIAGNOSIS — E8881 Metabolic syndrome: Secondary | ICD-10-CM | POA: Diagnosis not present

## 2022-07-16 DIAGNOSIS — H269 Unspecified cataract: Secondary | ICD-10-CM | POA: Diagnosis not present

## 2022-07-16 DIAGNOSIS — G4733 Obstructive sleep apnea (adult) (pediatric): Secondary | ICD-10-CM | POA: Diagnosis not present

## 2022-07-28 HISTORY — PX: INGUINAL HERNIA REPAIR: SUR1180

## 2022-07-30 DIAGNOSIS — Z01818 Encounter for other preprocedural examination: Secondary | ICD-10-CM | POA: Diagnosis not present

## 2022-07-31 DIAGNOSIS — I10 Essential (primary) hypertension: Secondary | ICD-10-CM | POA: Diagnosis not present

## 2022-07-31 DIAGNOSIS — K219 Gastro-esophageal reflux disease without esophagitis: Secondary | ICD-10-CM | POA: Diagnosis not present

## 2022-07-31 DIAGNOSIS — K429 Umbilical hernia without obstruction or gangrene: Secondary | ICD-10-CM | POA: Diagnosis not present

## 2022-07-31 DIAGNOSIS — K4090 Unilateral inguinal hernia, without obstruction or gangrene, not specified as recurrent: Secondary | ICD-10-CM | POA: Diagnosis not present

## 2022-07-31 DIAGNOSIS — K420 Umbilical hernia with obstruction, without gangrene: Secondary | ICD-10-CM | POA: Diagnosis not present

## 2022-07-31 DIAGNOSIS — E7800 Pure hypercholesterolemia, unspecified: Secondary | ICD-10-CM | POA: Diagnosis not present

## 2022-07-31 DIAGNOSIS — Z7983 Long term (current) use of bisphosphonates: Secondary | ICD-10-CM | POA: Diagnosis not present

## 2022-08-10 DIAGNOSIS — Z01810 Encounter for preprocedural cardiovascular examination: Secondary | ICD-10-CM | POA: Diagnosis not present

## 2022-08-10 DIAGNOSIS — K4090 Unilateral inguinal hernia, without obstruction or gangrene, not specified as recurrent: Secondary | ICD-10-CM | POA: Diagnosis not present

## 2022-08-14 DIAGNOSIS — H353211 Exudative age-related macular degeneration, right eye, with active choroidal neovascularization: Secondary | ICD-10-CM | POA: Diagnosis not present

## 2022-08-14 DIAGNOSIS — H353124 Nonexudative age-related macular degeneration, left eye, advanced atrophic with subfoveal involvement: Secondary | ICD-10-CM | POA: Diagnosis not present

## 2022-08-14 DIAGNOSIS — H353114 Nonexudative age-related macular degeneration, right eye, advanced atrophic with subfoveal involvement: Secondary | ICD-10-CM | POA: Diagnosis not present

## 2022-08-14 DIAGNOSIS — H401133 Primary open-angle glaucoma, bilateral, severe stage: Secondary | ICD-10-CM | POA: Diagnosis not present

## 2022-09-25 DIAGNOSIS — H401133 Primary open-angle glaucoma, bilateral, severe stage: Secondary | ICD-10-CM | POA: Diagnosis not present

## 2022-09-25 DIAGNOSIS — H353211 Exudative age-related macular degeneration, right eye, with active choroidal neovascularization: Secondary | ICD-10-CM | POA: Diagnosis not present

## 2022-09-25 DIAGNOSIS — H353124 Nonexudative age-related macular degeneration, left eye, advanced atrophic with subfoveal involvement: Secondary | ICD-10-CM | POA: Diagnosis not present

## 2022-09-25 DIAGNOSIS — H353114 Nonexudative age-related macular degeneration, right eye, advanced atrophic with subfoveal involvement: Secondary | ICD-10-CM | POA: Diagnosis not present

## 2022-10-12 DIAGNOSIS — Z1231 Encounter for screening mammogram for malignant neoplasm of breast: Secondary | ICD-10-CM | POA: Diagnosis not present

## 2022-11-06 DIAGNOSIS — H353211 Exudative age-related macular degeneration, right eye, with active choroidal neovascularization: Secondary | ICD-10-CM | POA: Diagnosis not present

## 2022-11-06 DIAGNOSIS — H353114 Nonexudative age-related macular degeneration, right eye, advanced atrophic with subfoveal involvement: Secondary | ICD-10-CM | POA: Diagnosis not present

## 2022-11-06 DIAGNOSIS — H353124 Nonexudative age-related macular degeneration, left eye, advanced atrophic with subfoveal involvement: Secondary | ICD-10-CM | POA: Diagnosis not present

## 2022-11-06 DIAGNOSIS — H401133 Primary open-angle glaucoma, bilateral, severe stage: Secondary | ICD-10-CM | POA: Diagnosis not present

## 2022-11-26 DIAGNOSIS — H353211 Exudative age-related macular degeneration, right eye, with active choroidal neovascularization: Secondary | ICD-10-CM | POA: Diagnosis not present

## 2022-11-26 DIAGNOSIS — H353124 Nonexudative age-related macular degeneration, left eye, advanced atrophic with subfoveal involvement: Secondary | ICD-10-CM | POA: Diagnosis not present

## 2022-11-26 DIAGNOSIS — Z961 Presence of intraocular lens: Secondary | ICD-10-CM | POA: Diagnosis not present

## 2022-11-26 DIAGNOSIS — H04123 Dry eye syndrome of bilateral lacrimal glands: Secondary | ICD-10-CM | POA: Diagnosis not present

## 2022-11-26 DIAGNOSIS — H401132 Primary open-angle glaucoma, bilateral, moderate stage: Secondary | ICD-10-CM | POA: Diagnosis not present

## 2022-11-29 DIAGNOSIS — H401133 Primary open-angle glaucoma, bilateral, severe stage: Secondary | ICD-10-CM | POA: Diagnosis not present

## 2022-11-29 DIAGNOSIS — H353114 Nonexudative age-related macular degeneration, right eye, advanced atrophic with subfoveal involvement: Secondary | ICD-10-CM | POA: Diagnosis not present

## 2022-11-29 DIAGNOSIS — H353211 Exudative age-related macular degeneration, right eye, with active choroidal neovascularization: Secondary | ICD-10-CM | POA: Diagnosis not present

## 2022-11-29 DIAGNOSIS — H353124 Nonexudative age-related macular degeneration, left eye, advanced atrophic with subfoveal involvement: Secondary | ICD-10-CM | POA: Diagnosis not present

## 2022-12-18 DIAGNOSIS — H353114 Nonexudative age-related macular degeneration, right eye, advanced atrophic with subfoveal involvement: Secondary | ICD-10-CM | POA: Diagnosis not present

## 2022-12-18 DIAGNOSIS — H353124 Nonexudative age-related macular degeneration, left eye, advanced atrophic with subfoveal involvement: Secondary | ICD-10-CM | POA: Diagnosis not present

## 2022-12-18 DIAGNOSIS — H353211 Exudative age-related macular degeneration, right eye, with active choroidal neovascularization: Secondary | ICD-10-CM | POA: Diagnosis not present

## 2022-12-18 DIAGNOSIS — H401133 Primary open-angle glaucoma, bilateral, severe stage: Secondary | ICD-10-CM | POA: Diagnosis not present

## 2023-01-07 DIAGNOSIS — E7849 Other hyperlipidemia: Secondary | ICD-10-CM | POA: Diagnosis not present

## 2023-01-07 DIAGNOSIS — H353211 Exudative age-related macular degeneration, right eye, with active choroidal neovascularization: Secondary | ICD-10-CM | POA: Diagnosis not present

## 2023-01-07 DIAGNOSIS — Z1329 Encounter for screening for other suspected endocrine disorder: Secondary | ICD-10-CM | POA: Diagnosis not present

## 2023-01-07 DIAGNOSIS — Z1321 Encounter for screening for nutritional disorder: Secondary | ICD-10-CM | POA: Diagnosis not present

## 2023-01-07 DIAGNOSIS — H04123 Dry eye syndrome of bilateral lacrimal glands: Secondary | ICD-10-CM | POA: Diagnosis not present

## 2023-01-07 DIAGNOSIS — Z961 Presence of intraocular lens: Secondary | ICD-10-CM | POA: Diagnosis not present

## 2023-01-07 DIAGNOSIS — K219 Gastro-esophageal reflux disease without esophagitis: Secondary | ICD-10-CM | POA: Diagnosis not present

## 2023-01-07 DIAGNOSIS — Z0001 Encounter for general adult medical examination with abnormal findings: Secondary | ICD-10-CM | POA: Diagnosis not present

## 2023-01-07 DIAGNOSIS — G4730 Sleep apnea, unspecified: Secondary | ICD-10-CM | POA: Diagnosis not present

## 2023-01-07 DIAGNOSIS — R7301 Impaired fasting glucose: Secondary | ICD-10-CM | POA: Diagnosis not present

## 2023-01-07 DIAGNOSIS — H401132 Primary open-angle glaucoma, bilateral, moderate stage: Secondary | ICD-10-CM | POA: Diagnosis not present

## 2023-01-07 DIAGNOSIS — H353124 Nonexudative age-related macular degeneration, left eye, advanced atrophic with subfoveal involvement: Secondary | ICD-10-CM | POA: Diagnosis not present

## 2023-01-14 DIAGNOSIS — G4733 Obstructive sleep apnea (adult) (pediatric): Secondary | ICD-10-CM | POA: Diagnosis not present

## 2023-01-14 DIAGNOSIS — I10 Essential (primary) hypertension: Secondary | ICD-10-CM | POA: Diagnosis not present

## 2023-01-14 DIAGNOSIS — R7301 Impaired fasting glucose: Secondary | ICD-10-CM | POA: Diagnosis not present

## 2023-01-14 DIAGNOSIS — E7849 Other hyperlipidemia: Secondary | ICD-10-CM | POA: Diagnosis not present

## 2023-01-14 DIAGNOSIS — H409 Unspecified glaucoma: Secondary | ICD-10-CM | POA: Diagnosis not present

## 2023-01-14 DIAGNOSIS — K2100 Gastro-esophageal reflux disease with esophagitis, without bleeding: Secondary | ICD-10-CM | POA: Diagnosis not present

## 2023-01-14 DIAGNOSIS — Z0001 Encounter for general adult medical examination with abnormal findings: Secondary | ICD-10-CM | POA: Diagnosis not present

## 2023-01-14 DIAGNOSIS — E8881 Metabolic syndrome: Secondary | ICD-10-CM | POA: Diagnosis not present

## 2023-01-14 DIAGNOSIS — H353290 Exudative age-related macular degeneration, unspecified eye, stage unspecified: Secondary | ICD-10-CM | POA: Diagnosis not present

## 2023-01-18 DIAGNOSIS — R109 Unspecified abdominal pain: Secondary | ICD-10-CM | POA: Diagnosis not present

## 2023-01-31 DIAGNOSIS — H401133 Primary open-angle glaucoma, bilateral, severe stage: Secondary | ICD-10-CM | POA: Diagnosis not present

## 2023-01-31 DIAGNOSIS — H353211 Exudative age-related macular degeneration, right eye, with active choroidal neovascularization: Secondary | ICD-10-CM | POA: Diagnosis not present

## 2023-01-31 DIAGNOSIS — H353114 Nonexudative age-related macular degeneration, right eye, advanced atrophic with subfoveal involvement: Secondary | ICD-10-CM | POA: Diagnosis not present

## 2023-01-31 DIAGNOSIS — H353124 Nonexudative age-related macular degeneration, left eye, advanced atrophic with subfoveal involvement: Secondary | ICD-10-CM | POA: Diagnosis not present

## 2023-02-12 DIAGNOSIS — R109 Unspecified abdominal pain: Secondary | ICD-10-CM | POA: Diagnosis not present

## 2023-02-12 DIAGNOSIS — I10 Essential (primary) hypertension: Secondary | ICD-10-CM | POA: Diagnosis not present

## 2023-02-12 DIAGNOSIS — E6609 Other obesity due to excess calories: Secondary | ICD-10-CM | POA: Diagnosis not present

## 2023-02-12 DIAGNOSIS — R7301 Impaired fasting glucose: Secondary | ICD-10-CM | POA: Diagnosis not present

## 2023-02-12 DIAGNOSIS — E7849 Other hyperlipidemia: Secondary | ICD-10-CM | POA: Diagnosis not present

## 2023-02-12 DIAGNOSIS — G4733 Obstructive sleep apnea (adult) (pediatric): Secondary | ICD-10-CM | POA: Diagnosis not present

## 2023-02-12 DIAGNOSIS — K2100 Gastro-esophageal reflux disease with esophagitis, without bleeding: Secondary | ICD-10-CM | POA: Diagnosis not present

## 2023-02-12 DIAGNOSIS — H409 Unspecified glaucoma: Secondary | ICD-10-CM | POA: Diagnosis not present

## 2023-02-12 DIAGNOSIS — H353290 Exudative age-related macular degeneration, unspecified eye, stage unspecified: Secondary | ICD-10-CM | POA: Diagnosis not present

## 2023-02-12 DIAGNOSIS — E8881 Metabolic syndrome: Secondary | ICD-10-CM | POA: Diagnosis not present

## 2023-02-12 DIAGNOSIS — R5383 Other fatigue: Secondary | ICD-10-CM | POA: Diagnosis not present

## 2023-02-28 DIAGNOSIS — Z6832 Body mass index (BMI) 32.0-32.9, adult: Secondary | ICD-10-CM | POA: Diagnosis not present

## 2023-02-28 DIAGNOSIS — R109 Unspecified abdominal pain: Secondary | ICD-10-CM | POA: Diagnosis not present

## 2023-02-28 DIAGNOSIS — R030 Elevated blood-pressure reading, without diagnosis of hypertension: Secondary | ICD-10-CM | POA: Diagnosis not present

## 2023-02-28 DIAGNOSIS — R102 Pelvic and perineal pain: Secondary | ICD-10-CM | POA: Diagnosis not present

## 2023-03-08 DIAGNOSIS — K449 Diaphragmatic hernia without obstruction or gangrene: Secondary | ICD-10-CM | POA: Diagnosis not present

## 2023-03-08 DIAGNOSIS — R102 Pelvic and perineal pain: Secondary | ICD-10-CM | POA: Diagnosis not present

## 2023-03-08 DIAGNOSIS — K5730 Diverticulosis of large intestine without perforation or abscess without bleeding: Secondary | ICD-10-CM | POA: Diagnosis not present

## 2023-03-08 DIAGNOSIS — R109 Unspecified abdominal pain: Secondary | ICD-10-CM | POA: Diagnosis not present

## 2023-03-18 DIAGNOSIS — Z6832 Body mass index (BMI) 32.0-32.9, adult: Secondary | ICD-10-CM | POA: Diagnosis not present

## 2023-03-18 DIAGNOSIS — R1084 Generalized abdominal pain: Secondary | ICD-10-CM | POA: Diagnosis not present

## 2023-04-04 NOTE — Progress Notes (Signed)
 GI Office Note    Referring Provider: Toribio Jerel MATSU, MD Primary Care Physician:  Toribio Jerel MATSU, MD  Primary Gastroenterologist: Ozell Hollingshead, MD   Chief Complaint   Chief Complaint  Patient presents with   Abdominal Pain    Had hernia surgery June 2024, abd pain started aprox. 3 mths after.      History of Present Illness   Sabrina Cantrell is a 83 y.o. female presenting today at the request of Dr. Toribio for abdominal pain.  Patient underwent laparoscopic totally extraperitoneal right inguinal hernia repair converted to transabdominal preperitoneal approach with mesh placement and primary umbilical hernia repair without mesh in 07/2022.  Three to four months after her surgery she started having periumbilical abdominal pain. Initially was having some postprandial pain but also notes pain without meals. Some days pain worse than others. Notes if she is able to lay flat on her back the pain will resolve in 10-15 minutes. If she does not lay down after pain starts, it will escalate. Feels like stretching out helps the pain. Does not feel pain is related to bowel function. One month ago, she says her belly button popped out and was hard but it went back down.   She has been taking Equate fiber two capsules daily for 25 years. After surgery, she was started on stool softener and she has continues as it seems to help BMs although she feels like she gets less of an urge to have a BM now after surgery. Sometimes skips a day or so without a stool. Stools can get hard. Denies diarrhea. No dysphagia. States with the pain she decreased her oral intake. She lost about 8 pounds but now she is trying to eat more and has gained a couple of pounds back.   She saw PCP for abdominal pain. She has not seen her surgeon. She reports u/s was unremarkable. CT completed as well without explanation for pain. She was started on bentyl, took for two weeks without relief. Taking gas pills. TUMS at bedtime  for heartburn.       CT A/P with contrast 02/2023: 1. 6 mm left solid pulmonary nodule.  2. Diverticulosis.  3. Small hiatal hernia.  4. No acute abdominal or pelvic pathology identified.  5. Aortic atherosclerosis.   Labs January 2025: 0215 IgE alpha gal less than 0.1, TTG IgA less than 2, endomysial antibody IgA negative, IgA 100, hematocrit 38.2, hemoglobin 13.1, white blood cell count 5.8, platelets 197,000, ALT 13, BUN 16, creatinine 0.96, albumin 4.2, total bilirubin 0.7, alkaline phosphatase 55, AST 15, TSH 2.751  Colonoscopy 05/2019: -diverticulosis   Medications   Current Outpatient Medications  Medication Sig Dispense Refill   acetaminophen  (TYLENOL ) 500 MG tablet Take 500 mg by mouth every 6 (six) hours as needed for moderate pain.     alendronate (FOSAMAX) 70 MG tablet Take 70 mg by mouth every Monday.      amLODipine (NORVASC) 5 MG tablet Take 5 mg by mouth daily.      Calcium Carb-Cholecalciferol (CALCIUM 600 + D PO) Take 1 tablet by mouth daily.     dicyclomine (BENTYL) 10 MG capsule Take 10 mg by mouth 2 (two) times daily.     dorzolamide-timolol (COSOPT) 22.3-6.8 MG/ML ophthalmic solution Place 1 drop into both eyes 2 (two) times daily.     FIBER PO Take 2 capsules by mouth daily.     gabapentin (NEURONTIN) 100 MG capsule Take 100 mg by mouth daily.  latanoprost (XALATAN) 0.005 % ophthalmic solution Place 1 drop into both eyes at bedtime.     losartan-hydrochlorothiazide (HYZAAR) 50-12.5 MG tablet Take 1 tablet by mouth daily.     Melatonin 5 MG CAPS Take 5 mg by mouth at bedtime.     Multiple Vitamins-Minerals (PRESERVISION AREDS 2) CAPS Take 1 capsule by mouth 2 (two) times daily.     simvastatin (ZOCOR) 40 MG tablet Take 40 mg by mouth every evening.     No current facility-administered medications for this visit.    Allergies   Allergies as of 04/05/2023   (No Known Allergies)    Past Medical History   Past Medical History:  Diagnosis Date   HTN  (hypertension)    Hypercholesterolemia    Macular degeneration    Nuclear sclerotic cataract of both eyes 12/03/2019   Osteopenia     Past Surgical History   Past Surgical History:  Procedure Laterality Date   COLONOSCOPY N/A 01/13/2013   External hemorrhoids. 5 mm polyp (tubular adenoma) 10 cm from anal verge. Colonic diverticulosis.    COLONOSCOPY N/A 06/05/2019   Procedure: COLONOSCOPY;  Surgeon: Shaaron Lamar HERO, MD;  [facility];  Service: Endoscopy;  Laterality: N/A;  12:00pm   INGUINAL HERNIA REPAIR Right 07/2022   with mesh. umb hernia without mesh at same time    Past Family History   Family History  Problem Relation Age of Onset   Colon cancer Sister 49       deceased at age 26    Past Social History   Social History   Socioeconomic History   Marital status: Married    Spouse name: Not on file   Number of children: Not on file   Years of education: Not on file   Highest education level: Not on file  Occupational History   Occupation: retired  Tobacco Use   Smoking status: Never   Smokeless tobacco: Never  Vaping Use   Vaping status: Never Used  Substance and Sexual Activity   Alcohol  use: No   Drug use: Never   Sexual activity: Yes  Other Topics Concern   Not on file  Social History Narrative   Not on file   Social Drivers of Health   Financial Resource Strain: Not on file  Food Insecurity: Not on file  Transportation Needs: No Transportation Needs (02/22/2022)   Received from [facility]   PRAPARE - Transportation    Lack of Transportation (Medical): No    Lack of Transportation (Non-Medical): No  Physical Activity: Not on file  Stress: No Stress Concern Present (04/10/2022)   Received from [facility] - Occupational Stress Questionnaire    Feeling of Stress : Only a little  Social Connections: Not on file  Intimate Partner Violence: Not At Risk (04/19/2022)   [facility] from [facility]   Humiliation, Afraid, Rape, and Kick questionnaire    Fear of Current or Ex-Partner: No    Emotionally Abused: No    Physically Abused: No    Sexually Abused: No    Review of Systems   General: Negative for anorexia,  fever, chills, fatigue, weakness. See hpi Eyes: Negative for vision changes.  ENT: Negative for hoarseness, difficulty swallowing , nasal congestion. CV: Negative for chest pain, angina, palpitations, dyspnea on exertion, peripheral edema.  Respiratory: Negative for dyspnea at rest, dyspnea on exertion, cough, sputum, wheezing.  GI: See history of present illness. GU:  Negative for dysuria, hematuria, urinary incontinence, urinary frequency, nocturnal urination.  MS: Negative for joint pain, low back pain.  Derm: Negative for rash or itching.  Neuro: Negative for weakness, abnormal sensation, seizure, frequent headaches, memory loss,  confusion.  Psych: Negative for anxiety, depression, suicidal ideation, hallucinations.  Endo: Negative for unusual weight change.  Heme: Negative for bruising or bleeding. Allergy: Negative for rash or hives.  Physical Exam   BP 130/81 (BP Location: Right Arm, Patient Position: Sitting, Cuff Size: Normal)   Pulse 65   Temp 98.3 F (36.8 C) (Oral)   Ht 5' 4 (1.626 m)   Wt 188 lb 9.6 oz (85.5 kg)   LMP  (LMP Unknown)   SpO2 94%   BMI 32.37 kg/m    General: Well-nourished, well-developed in no acute distress.  Head: Normocephalic, atraumatic.   Eyes: Conjunctiva pink, no icterus. Mouth: Oropharyngeal mucosa moist and pink  Neck: Supple without thyromegaly, masses, or lymphadenopathy.  Lungs: Clear to auscultation bilaterally.  Heart: Regular rate and rhythm, no murmurs rubs or gallops.  Abdomen: Bowel sounds are normal, nontender, nondistended, no hepatosplenomegaly or masses,  no abdominal bruits or hernia, no rebound or guarding.  Two inch incision to right of midline, well healed.  Rectal: not  performed Extremities: No lower extremity edema. No clubbing or deformities.  Neuro: Alert and oriented x 4 , grossly normal neurologically.  Skin: Warm and dry, no rash or jaundice.   Psych: Alert and cooperative, normal mood and affect.  Labs   See hpi  Imaging Studies   No results found.  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 Shaaron  Constipation: -continue fiber -stop dicyclomine -add miralax  one capful daily. Hold for 24 hours if develops frequent or loose stools        Sonny RAMAN. Ezzard, MHS, PA-C [facility]

## 2023-04-05 ENCOUNTER — Encounter: Payer: Self-pay | Admitting: Gastroenterology

## 2023-04-05 ENCOUNTER — Ambulatory Visit (INDEPENDENT_AMBULATORY_CARE_PROVIDER_SITE_OTHER): Payer: Medicare HMO | Admitting: Gastroenterology

## 2023-04-05 VITALS — BP 130/81 | HR 65 | Temp 98.3°F | Ht 64.0 in | Wt 188.6 lb

## 2023-04-05 DIAGNOSIS — K5900 Constipation, unspecified: Secondary | ICD-10-CM | POA: Diagnosis not present

## 2023-04-05 DIAGNOSIS — R1033 Periumbilical pain: Secondary | ICD-10-CM | POA: Diagnosis not present

## 2023-04-05 MED ORDER — POLYETHYLENE GLYCOL 3350 17 GM/SCOOP PO POWD: ORAL | Status: AC

## 2023-04-05 NOTE — Patient Instructions (Signed)
 Stop dicyclomine. Continue fiber daily. Add miralax  one capful daily. Hold for 24 hours if you have frequent/liquid stool. I will get copy of ultrasound report for review. I will look at images of your CT, if we cannot get them, we will let you know. You may need a special CT to look a blood flow to your intestines. Let me discuss further with Dr. Shaaron and we will let you know something by next week.  Please call with any questions or concerns.

## 2023-04-13 ENCOUNTER — Encounter: Payer: Self-pay | Admitting: Gastroenterology

## 2023-04-13 NOTE — Progress Notes (Signed)
Reviewed abdominal u/s dated 12/2022, unremarkable.

## 2023-04-16 DIAGNOSIS — H353114 Nonexudative age-related macular degeneration, right eye, advanced atrophic with subfoveal involvement: Secondary | ICD-10-CM | POA: Diagnosis not present

## 2023-04-16 DIAGNOSIS — H353124 Nonexudative age-related macular degeneration, left eye, advanced atrophic with subfoveal involvement: Secondary | ICD-10-CM | POA: Diagnosis not present

## 2023-04-16 DIAGNOSIS — H353221 Exudative age-related macular degeneration, left eye, with active choroidal neovascularization: Secondary | ICD-10-CM | POA: Diagnosis not present

## 2023-04-16 DIAGNOSIS — H401133 Primary open-angle glaucoma, bilateral, severe stage: Secondary | ICD-10-CM | POA: Diagnosis not present

## 2023-04-19 ENCOUNTER — Telehealth: Payer: Self-pay

## 2023-04-19 NOTE — Telephone Encounter (Signed)
Pt called wanting to know if you had a chance to get with Dr. Jena Gauss. Pt stated that you was going to let her know something by last week but she hasn't heard anything.

## 2023-04-22 NOTE — Telephone Encounter (Signed)
 Pt was made aware and verbalized understanding.

## 2023-04-22 NOTE — Telephone Encounter (Signed)
 Please let patient know that I was able to obtain the CT images from the CT she had after her hernia repairs. I reviewed these with the radiologist. There is no evidence of recurrent hernia, notes typical postoperative changes you would see after hernia repairs. Nothing to explain her pain.  I discussed findings and symptoms with Dr. Jena Gauss. He is very suspicious that her symptoms are coming from the abdominal wall given that laying down/stretching out resolves her pain.   Dr. Jena Gauss is advising patient go back to see her surgeon.

## 2023-04-29 DIAGNOSIS — R1033 Periumbilical pain: Secondary | ICD-10-CM | POA: Diagnosis not present

## 2023-05-07 DIAGNOSIS — H353211 Exudative age-related macular degeneration, right eye, with active choroidal neovascularization: Secondary | ICD-10-CM | POA: Diagnosis not present

## 2023-05-07 DIAGNOSIS — H401133 Primary open-angle glaucoma, bilateral, severe stage: Secondary | ICD-10-CM | POA: Diagnosis not present

## 2023-05-07 DIAGNOSIS — H353134 Nonexudative age-related macular degeneration, bilateral, advanced atrophic with subfoveal involvement: Secondary | ICD-10-CM | POA: Diagnosis not present

## 2023-05-14 DIAGNOSIS — H04123 Dry eye syndrome of bilateral lacrimal glands: Secondary | ICD-10-CM | POA: Diagnosis not present

## 2023-05-14 DIAGNOSIS — Z961 Presence of intraocular lens: Secondary | ICD-10-CM | POA: Diagnosis not present

## 2023-05-14 DIAGNOSIS — H401132 Primary open-angle glaucoma, bilateral, moderate stage: Secondary | ICD-10-CM | POA: Diagnosis not present

## 2023-05-21 DIAGNOSIS — H353124 Nonexudative age-related macular degeneration, left eye, advanced atrophic with subfoveal involvement: Secondary | ICD-10-CM | POA: Diagnosis not present

## 2023-05-21 DIAGNOSIS — H353114 Nonexudative age-related macular degeneration, right eye, advanced atrophic with subfoveal involvement: Secondary | ICD-10-CM | POA: Diagnosis not present

## 2023-05-21 DIAGNOSIS — H353221 Exudative age-related macular degeneration, left eye, with active choroidal neovascularization: Secondary | ICD-10-CM | POA: Diagnosis not present

## 2023-05-21 DIAGNOSIS — H401133 Primary open-angle glaucoma, bilateral, severe stage: Secondary | ICD-10-CM | POA: Diagnosis not present

## 2023-05-28 DIAGNOSIS — R1084 Generalized abdominal pain: Secondary | ICD-10-CM | POA: Diagnosis not present

## 2023-05-28 DIAGNOSIS — Z6832 Body mass index (BMI) 32.0-32.9, adult: Secondary | ICD-10-CM | POA: Diagnosis not present

## 2023-06-10 DIAGNOSIS — Z6831 Body mass index (BMI) 31.0-31.9, adult: Secondary | ICD-10-CM | POA: Diagnosis not present

## 2023-06-10 DIAGNOSIS — R1084 Generalized abdominal pain: Secondary | ICD-10-CM | POA: Diagnosis not present

## 2023-06-25 DIAGNOSIS — H353134 Nonexudative age-related macular degeneration, bilateral, advanced atrophic with subfoveal involvement: Secondary | ICD-10-CM | POA: Diagnosis not present

## 2023-06-25 DIAGNOSIS — H401133 Primary open-angle glaucoma, bilateral, severe stage: Secondary | ICD-10-CM | POA: Diagnosis not present

## 2023-06-25 DIAGNOSIS — H353114 Nonexudative age-related macular degeneration, right eye, advanced atrophic with subfoveal involvement: Secondary | ICD-10-CM | POA: Diagnosis not present

## 2023-06-25 DIAGNOSIS — H353221 Exudative age-related macular degeneration, left eye, with active choroidal neovascularization: Secondary | ICD-10-CM | POA: Diagnosis not present

## 2023-07-11 DIAGNOSIS — E7849 Other hyperlipidemia: Secondary | ICD-10-CM | POA: Diagnosis not present

## 2023-07-11 DIAGNOSIS — E559 Vitamin D deficiency, unspecified: Secondary | ICD-10-CM | POA: Diagnosis not present

## 2023-07-11 DIAGNOSIS — R7301 Impaired fasting glucose: Secondary | ICD-10-CM | POA: Diagnosis not present

## 2023-07-11 DIAGNOSIS — I10 Essential (primary) hypertension: Secondary | ICD-10-CM | POA: Diagnosis not present

## 2023-07-17 DIAGNOSIS — R1084 Generalized abdominal pain: Secondary | ICD-10-CM | POA: Diagnosis not present

## 2023-07-17 DIAGNOSIS — E559 Vitamin D deficiency, unspecified: Secondary | ICD-10-CM | POA: Diagnosis not present

## 2023-07-17 DIAGNOSIS — I10 Essential (primary) hypertension: Secondary | ICD-10-CM | POA: Diagnosis not present

## 2023-07-17 DIAGNOSIS — Z6832 Body mass index (BMI) 32.0-32.9, adult: Secondary | ICD-10-CM | POA: Diagnosis not present

## 2023-07-17 DIAGNOSIS — G4733 Obstructive sleep apnea (adult) (pediatric): Secondary | ICD-10-CM | POA: Diagnosis not present

## 2023-08-06 DIAGNOSIS — H401133 Primary open-angle glaucoma, bilateral, severe stage: Secondary | ICD-10-CM | POA: Diagnosis not present

## 2023-08-06 DIAGNOSIS — H353211 Exudative age-related macular degeneration, right eye, with active choroidal neovascularization: Secondary | ICD-10-CM | POA: Diagnosis not present

## 2023-08-06 DIAGNOSIS — H353134 Nonexudative age-related macular degeneration, bilateral, advanced atrophic with subfoveal involvement: Secondary | ICD-10-CM | POA: Diagnosis not present

## 2023-08-13 DIAGNOSIS — H353221 Exudative age-related macular degeneration, left eye, with active choroidal neovascularization: Secondary | ICD-10-CM | POA: Diagnosis not present

## 2023-08-13 DIAGNOSIS — H353134 Nonexudative age-related macular degeneration, bilateral, advanced atrophic with subfoveal involvement: Secondary | ICD-10-CM | POA: Diagnosis not present

## 2023-08-13 DIAGNOSIS — H401133 Primary open-angle glaucoma, bilateral, severe stage: Secondary | ICD-10-CM | POA: Diagnosis not present

## 2023-09-19 NOTE — Procedures (Signed)
 Patient was seen in the lab today for a mask fit session. The patient has been using nasal masks only. She has 4 masks, 2 nasal pillow version, and 2 nasal mask version. The patient has never tried a full face style mask. Asked patient what her mask fit issues were; she states the pillows don't stay in her nostrils and she is constantly adjusting, sometimes it's not the pillows and it is one side of her nose stopped up. The nasal mask seems to pinch her nose closed. She also states waking with very dry mouth to the point of choking at times. She does not know if she vents out of her mouth but suspects. Per Dr. Cyndi notes tech pulled the Airfit F30 hybrid mask. The patient did not wish to try it, and does not like the hose attachment off the top of the headgear. Tech fitted patient for the [employer]; Airfit F20 full-face mask: size Medium. Patient did very well with this mask. She was trialed on CPAP 8-13 cm/H2O. Leak was 0-1 in 3 positions, L-Lateral, R-Lateral, and supine. Patient stated it felt more comfortable to breathe, and felt less fullness in her nose. Patient asked about cleaning the mask/cushion etc. We briefly went over cleaning the equipment. She had also been wiping her cushion off at night before putting it on for sleep. She wears moisturizer etc. Recommended she gently clean the mask cushion in the morning after taking it off to remove the oils etc as to preserve cushion seal. HN  -HN

## 2023-09-26 DIAGNOSIS — H353221 Exudative age-related macular degeneration, left eye, with active choroidal neovascularization: Secondary | ICD-10-CM | POA: Diagnosis not present

## 2023-09-26 DIAGNOSIS — H353124 Nonexudative age-related macular degeneration, left eye, advanced atrophic with subfoveal involvement: Secondary | ICD-10-CM | POA: Diagnosis not present

## 2023-09-26 DIAGNOSIS — H401133 Primary open-angle glaucoma, bilateral, severe stage: Secondary | ICD-10-CM | POA: Diagnosis not present

## 2023-10-23 DIAGNOSIS — Z1231 Encounter for screening mammogram for malignant neoplasm of breast: Secondary | ICD-10-CM | POA: Diagnosis not present

## 2023-11-04 DIAGNOSIS — H353124 Nonexudative age-related macular degeneration, left eye, advanced atrophic with subfoveal involvement: Secondary | ICD-10-CM | POA: Diagnosis not present

## 2023-11-04 DIAGNOSIS — H401133 Primary open-angle glaucoma, bilateral, severe stage: Secondary | ICD-10-CM | POA: Diagnosis not present

## 2023-11-04 DIAGNOSIS — H353114 Nonexudative age-related macular degeneration, right eye, advanced atrophic with subfoveal involvement: Secondary | ICD-10-CM | POA: Diagnosis not present

## 2023-11-04 DIAGNOSIS — H353231 Exudative age-related macular degeneration, bilateral, with active choroidal neovascularization: Secondary | ICD-10-CM | POA: Diagnosis not present

## 2023-11-06 DIAGNOSIS — H401132 Primary open-angle glaucoma, bilateral, moderate stage: Secondary | ICD-10-CM | POA: Diagnosis not present

## 2023-11-06 DIAGNOSIS — H26493 Other secondary cataract, bilateral: Secondary | ICD-10-CM | POA: Diagnosis not present

## 2023-11-06 DIAGNOSIS — H04123 Dry eye syndrome of bilateral lacrimal glands: Secondary | ICD-10-CM | POA: Diagnosis not present

## 2023-11-06 DIAGNOSIS — H2511 Age-related nuclear cataract, right eye: Secondary | ICD-10-CM | POA: Diagnosis not present

## 2023-12-17 DIAGNOSIS — H353124 Nonexudative age-related macular degeneration, left eye, advanced atrophic with subfoveal involvement: Secondary | ICD-10-CM | POA: Diagnosis not present

## 2023-12-17 DIAGNOSIS — H353114 Nonexudative age-related macular degeneration, right eye, advanced atrophic with subfoveal involvement: Secondary | ICD-10-CM | POA: Diagnosis not present

## 2023-12-17 DIAGNOSIS — H401133 Primary open-angle glaucoma, bilateral, severe stage: Secondary | ICD-10-CM | POA: Diagnosis not present

## 2023-12-17 DIAGNOSIS — H353221 Exudative age-related macular degeneration, left eye, with active choroidal neovascularization: Secondary | ICD-10-CM | POA: Diagnosis not present

## 2024-01-08 DIAGNOSIS — E559 Vitamin D deficiency, unspecified: Secondary | ICD-10-CM | POA: Diagnosis not present

## 2024-01-08 DIAGNOSIS — D559 Anemia due to enzyme disorder, unspecified: Secondary | ICD-10-CM | POA: Diagnosis not present

## 2024-01-08 DIAGNOSIS — I10 Essential (primary) hypertension: Secondary | ICD-10-CM | POA: Diagnosis not present

## 2024-01-08 DIAGNOSIS — E7849 Other hyperlipidemia: Secondary | ICD-10-CM | POA: Diagnosis not present

## 2024-01-08 DIAGNOSIS — R7301 Impaired fasting glucose: Secondary | ICD-10-CM | POA: Diagnosis not present

## 2024-01-08 DIAGNOSIS — Z1329 Encounter for screening for other suspected endocrine disorder: Secondary | ICD-10-CM | POA: Diagnosis not present
